# Patient Record
Sex: Female | Born: 1980 | Race: White | Hispanic: No | Marital: Married | Smoking: Former smoker
Health system: Southern US, Community
[De-identification: ages and names within clinical notes are randomized; demographics above are authoritative.]

## PROBLEM LIST (undated history)

## (undated) DIAGNOSIS — E162 Hypoglycemia, unspecified: Secondary | ICD-10-CM

## (undated) DIAGNOSIS — F909 Attention-deficit hyperactivity disorder, unspecified type: Secondary | ICD-10-CM

## (undated) DIAGNOSIS — O24419 Gestational diabetes mellitus in pregnancy, unspecified control: Secondary | ICD-10-CM

## (undated) DIAGNOSIS — D649 Anemia, unspecified: Secondary | ICD-10-CM

## (undated) HISTORY — DX: Gestational diabetes mellitus in pregnancy, unspecified control: O24.419

## (undated) HISTORY — DX: Anemia, unspecified: D64.9

---

## 2009-05-22 ENCOUNTER — Inpatient Hospital Stay (HOSPITAL_COMMUNITY): Admission: AD | Admit: 2009-05-22 | Discharge: 2009-05-22 | Payer: Self-pay | Admitting: Obstetrics & Gynecology

## 2009-05-22 ENCOUNTER — Ambulatory Visit: Payer: Self-pay | Admitting: Obstetrics and Gynecology

## 2010-06-04 LAB — URINALYSIS, ROUTINE W REFLEX MICROSCOPIC
Glucose, UA: NEGATIVE mg/dL
Hgb urine dipstick: NEGATIVE
Ketones, ur: NEGATIVE mg/dL
Protein, ur: NEGATIVE mg/dL
Urobilinogen, UA: 4 mg/dL — ABNORMAL HIGH (ref 0.0–1.0)

## 2011-11-26 ENCOUNTER — Emergency Department (HOSPITAL_COMMUNITY)
Admission: EM | Admit: 2011-11-26 | Discharge: 2011-11-26 | Disposition: A | Discharge status: Home / Self Care | Payer: Self-pay | Attending: Emergency Medicine | Admitting: Emergency Medicine

## 2011-11-26 ENCOUNTER — Encounter (HOSPITAL_COMMUNITY): Payer: Self-pay | Admitting: *Deleted

## 2011-11-26 ENCOUNTER — Emergency Department (HOSPITAL_COMMUNITY): Payer: Self-pay

## 2011-11-26 DIAGNOSIS — Y998 Other external cause status: Secondary | ICD-10-CM | POA: Insufficient documentation

## 2011-11-26 DIAGNOSIS — Y9389 Activity, other specified: Secondary | ICD-10-CM | POA: Insufficient documentation

## 2011-11-26 DIAGNOSIS — T148XXA Other injury of unspecified body region, initial encounter: Secondary | ICD-10-CM

## 2011-11-26 DIAGNOSIS — X500XXA Overexertion from strenuous movement or load, initial encounter: Secondary | ICD-10-CM | POA: Insufficient documentation

## 2011-11-26 DIAGNOSIS — IMO0002 Reserved for concepts with insufficient information to code with codable children: Secondary | ICD-10-CM | POA: Insufficient documentation

## 2011-11-26 LAB — POCT PREGNANCY, URINE: Preg Test, Ur: NEGATIVE

## 2011-11-26 MED ORDER — HYDROCODONE-ACETAMINOPHEN 5-325 MG PO TABS: 1.0000 | ORAL_TABLET | Freq: Four times a day (QID) | ORAL | Status: AC | PRN

## 2011-11-26 MED ORDER — CYCLOBENZAPRINE HCL 10 MG PO TABS: ORAL_TABLET | ORAL | Status: DC

## 2011-11-26 MED ORDER — HYDROCODONE-ACETAMINOPHEN 5-325 MG PO TABS
1.0000 | ORAL_TABLET | Freq: Once | ORAL | Status: AC
  Administered 2011-11-26: 1 via ORAL
  Filled 2011-11-26: qty 1

## 2011-11-26 MED ORDER — IBUPROFEN 800 MG PO TABS
800.0000 mg | ORAL_TABLET | Freq: Once | ORAL | Status: AC
  Administered 2011-11-26: 800 mg via ORAL
  Filled 2011-11-26: qty 1

## 2011-11-26 MED ORDER — CYCLOBENZAPRINE HCL 10 MG PO TABS
10.0000 mg | ORAL_TABLET | Freq: Once | ORAL | Status: AC
  Administered 2011-11-26: 10 mg via ORAL
  Filled 2011-11-26: qty 1

## 2011-11-26 NOTE — ED Provider Notes (Signed)
History     CSN: [id number]  Arrival date & time 11/26/11  2021   None     Chief Complaint  Patient presents with  . Back Pain  . 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  Pain in and around L scapula.  No trauma.  No CP or SOB  Patient is a 31 y.o. female presenting with back pain and shoulder pain. The history is provided by the patient. No language interpreter was used.  Back Pain  This is a new problem. The current episode started yesterday. The problem occurs constantly. The problem has not changed since onset.The pain is associated with lifting heavy objects. Pain location: L scapular area. The quality of the pain is described as stabbing. The pain does not radiate. The pain is severe. The pain is the same all the time. Pertinent negatives include no chest pain, no fever, no numbness, no perianal numbness, no bladder incontinence, no leg pain, no paresthesias, no paresis, no tingling and no weakness. She has tried NSAIDs, heat and ice for the symptoms. The treatment provided no relief.  Shoulder Pain Pertinent negatives include no chest pain, coughing, fever, numbness or weakness.    History reviewed. No pertinent past medical history.  History reviewed. No pertinent past surgical history.  History reviewed. No pertinent family history.  History  Substance Use Topics  . Smoking status: Never Smoker   . Smokeless tobacco: Not on file  . Alcohol Use: No    OB History    Grav Para Term Preterm Abortions TAB SAB Ect Mult Living                  Review of Systems  Constitutional: Negative for fever.  Respiratory: Negative for cough, chest tightness, shortness of breath, wheezing and stridor.   Cardiovascular: Negative for chest pain.  Genitourinary: Negative for bladder incontinence.  Musculoskeletal: Positive for back pain.    Neurological: Negative for tingling, weakness, numbness and paresthesias.  All other systems reviewed and are negative.    Allergies  Sulfa antibiotics  Home Medications   Current Outpatient Rx  Name Route Sig Dispense Refill  . CYCLOBENZAPRINE HCL 10 MG PO TABS  1/2 to one tab po TID 20 tablet 0  . HYDROCODONE-ACETAMINOPHEN 5-325 MG PO TABS Oral Take 1 tablet by mouth every 6 (six) hours as needed for pain. 20 tablet 0    BP 110/69  Pulse 72  Temp 98 F (36.7 C) (Oral)  Resp 20  Ht 5\' 2"  (1.575 m)  Wt 150 lb (68.04 kg)  BMI 27.44 kg/m2  SpO2 98%  LMP 08/26/2011  Physical Exam  Nursing note and vitals reviewed. Constitutional: She is oriented to person, place, and time. She appears well-developed and well-nourished. No distress.  HENT:  Head: Normocephalic and atraumatic.  Eyes: EOM are normal.  Neck: Normal range of motion.  Cardiovascular: Normal rate, regular rhythm and normal heart sounds.   Pulmonary/Chest: Effort normal and breath sounds normal.  Abdominal: Soft. She exhibits no distension. There is no tenderness.  Musculoskeletal: She exhibits tenderness.       Thoracic back: She exhibits decreased range of motion, tenderness and pain. She exhibits no bony tenderness, no spasm and normal pulse.       Back:  Neurological: She is alert and oriented to person, place, and time.  Skin: Skin is warm and dry.  Psychiatric: She  has a normal mood and affect. Judgment normal.    ED Course  Procedures (including critical care time)   Labs Reviewed  POCT PREGNANCY, URINE   Dg Shoulder Left  11/26/2011  *RADIOLOGY REPORT*  Clinical Data: Back pain and shoulder pain.  LEFT SHOULDER - 2+ VIEW  Comparison: None.  Findings: There is no evidence of fracture or dislocation.  There is no evidence of arthropathy or other focal bony abnormality. Soft tissues are unremarkable.  IMPRESSION: Negative exam.   Original Report Authenticated By: Elsie Stain, M.D.      1.  Muscle strain       MDM  E, rx-flexeril, 20 rx-hydrocodone, 20 Ibuprofen 800 mg TID F/u with dr. Romeo Apple prn        Evalina Field, PA 11/26/11 2152

## 2011-11-26 NOTE — ED Notes (Signed)
Pt with left shoulder/upper back pain since yesterday after lifting several heavy bags, has tried ice without relief

## 2011-11-27 NOTE — ED Provider Notes (Signed)
Medical screening examination/treatment/procedure(s) were performed by non-physician practitioner and as supervising physician I was immediately available for consultation/collaboration  Katriel Cutsforth R. Sage Hammill, MD 11/27/11 0021 

## 2012-07-29 ENCOUNTER — Encounter (HOSPITAL_COMMUNITY): Payer: Self-pay | Admitting: Emergency Medicine

## 2012-07-29 ENCOUNTER — Emergency Department (HOSPITAL_COMMUNITY)
Admission: EM | Admit: 2012-07-29 | Discharge: 2012-07-29 | Disposition: A | Discharge status: Home / Self Care | Payer: Medicaid Other | Attending: Emergency Medicine | Admitting: Emergency Medicine

## 2012-07-29 DIAGNOSIS — O9989 Other specified diseases and conditions complicating pregnancy, childbirth and the puerperium: Secondary | ICD-10-CM | POA: Insufficient documentation

## 2012-07-29 DIAGNOSIS — Z3490 Encounter for supervision of normal pregnancy, unspecified, unspecified trimester: Secondary | ICD-10-CM

## 2012-07-29 DIAGNOSIS — F419 Anxiety disorder, unspecified: Secondary | ICD-10-CM

## 2012-07-29 DIAGNOSIS — Z8659 Personal history of other mental and behavioral disorders: Secondary | ICD-10-CM | POA: Insufficient documentation

## 2012-07-29 DIAGNOSIS — Z79899 Other long term (current) drug therapy: Secondary | ICD-10-CM | POA: Insufficient documentation

## 2012-07-29 DIAGNOSIS — O219 Vomiting of pregnancy, unspecified: Secondary | ICD-10-CM | POA: Insufficient documentation

## 2012-07-29 DIAGNOSIS — IMO0002 Reserved for concepts with insufficient information to code with codable children: Secondary | ICD-10-CM | POA: Insufficient documentation

## 2012-07-29 DIAGNOSIS — Z87891 Personal history of nicotine dependence: Secondary | ICD-10-CM | POA: Insufficient documentation

## 2012-07-29 DIAGNOSIS — R109 Unspecified abdominal pain: Secondary | ICD-10-CM | POA: Insufficient documentation

## 2012-07-29 DIAGNOSIS — F411 Generalized anxiety disorder: Secondary | ICD-10-CM | POA: Insufficient documentation

## 2012-07-29 DIAGNOSIS — Z862 Personal history of diseases of the blood and blood-forming organs and certain disorders involving the immune mechanism: Secondary | ICD-10-CM | POA: Insufficient documentation

## 2012-07-29 DIAGNOSIS — Z8639 Personal history of other endocrine, nutritional and metabolic disease: Secondary | ICD-10-CM | POA: Insufficient documentation

## 2012-07-29 HISTORY — DX: Hypoglycemia, unspecified: E16.2

## 2012-07-29 HISTORY — DX: Attention-deficit hyperactivity disorder, unspecified type: F90.9

## 2012-07-29 LAB — RAPID URINE DRUG SCREEN, HOSP PERFORMED
Amphetamines: NOT DETECTED
Barbiturates: NOT DETECTED
Opiates: NOT DETECTED
Tetrahydrocannabinol: NOT DETECTED

## 2012-07-29 LAB — CBC WITH DIFFERENTIAL/PLATELET
Basophils Absolute: 0 10*3/uL (ref 0.0–0.1)
Eosinophils Relative: 0 % (ref 0–5)
HCT: 29.9 % — ABNORMAL LOW (ref 36.0–46.0)
Hemoglobin: 10.6 g/dL — ABNORMAL LOW (ref 12.0–15.0)
Lymphocytes Relative: 16 % (ref 12–46)
Lymphs Abs: 1.9 10*3/uL (ref 0.7–4.0)
MCV: 83.3 fL (ref 78.0–100.0)
Monocytes Absolute: 0.6 10*3/uL (ref 0.1–1.0)
Monocytes Relative: 6 % (ref 3–12)
Neutro Abs: 9.1 10*3/uL — ABNORMAL HIGH (ref 1.7–7.7)
RBC: 3.59 MIL/uL — ABNORMAL LOW (ref 3.87–5.11)
RDW: 14 % (ref 11.5–15.5)
WBC: 11.7 10*3/uL — ABNORMAL HIGH (ref 4.0–10.5)

## 2012-07-29 LAB — BASIC METABOLIC PANEL
CO2: 22 mEq/L (ref 19–32)
Chloride: 102 mEq/L (ref 96–112)
Creatinine, Ser: 0.43 mg/dL — ABNORMAL LOW (ref 0.50–1.10)
Glucose, Bld: 116 mg/dL — ABNORMAL HIGH (ref 70–99)

## 2012-07-29 LAB — HEPATIC FUNCTION PANEL
Alkaline Phosphatase: 62 U/L (ref 39–117)
Total Bilirubin: 0.3 mg/dL (ref 0.3–1.2)
Total Protein: 6.8 g/dL (ref 6.0–8.3)

## 2012-07-29 LAB — LIPASE, BLOOD: Lipase: 28 U/L (ref 11–59)
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
## 2013-03-29 ENCOUNTER — Encounter (HOSPITAL_COMMUNITY): Payer: Self-pay | Admitting: Emergency Medicine

## 2013-03-29 ENCOUNTER — Emergency Department (HOSPITAL_COMMUNITY): Payer: Medicaid Other

## 2013-03-29 ENCOUNTER — Emergency Department (HOSPITAL_COMMUNITY)
Admission: EM | Admit: 2013-03-29 | Discharge: 2013-03-29 | Disposition: A | Discharge status: Home / Self Care | Payer: Medicaid Other | Attending: Emergency Medicine | Admitting: Emergency Medicine

## 2013-03-29 DIAGNOSIS — Z8639 Personal history of other endocrine, nutritional and metabolic disease: Secondary | ICD-10-CM | POA: Insufficient documentation

## 2013-03-29 DIAGNOSIS — K921 Melena: Secondary | ICD-10-CM | POA: Insufficient documentation

## 2013-03-29 DIAGNOSIS — Z862 Personal history of diseases of the blood and blood-forming organs and certain disorders involving the immune mechanism: Secondary | ICD-10-CM | POA: Insufficient documentation

## 2013-03-29 DIAGNOSIS — Z3202 Encounter for pregnancy test, result negative: Secondary | ICD-10-CM | POA: Insufficient documentation

## 2013-03-29 DIAGNOSIS — K5900 Constipation, unspecified: Secondary | ICD-10-CM | POA: Insufficient documentation

## 2013-03-29 DIAGNOSIS — Z87891 Personal history of nicotine dependence: Secondary | ICD-10-CM | POA: Insufficient documentation

## 2013-03-29 DIAGNOSIS — Z79899 Other long term (current) drug therapy: Secondary | ICD-10-CM | POA: Insufficient documentation

## 2013-03-29 DIAGNOSIS — Z8659 Personal history of other mental and behavioral disorders: Secondary | ICD-10-CM | POA: Insufficient documentation

## 2013-03-29 LAB — URINE MICROSCOPIC-ADD ON

## 2013-03-29 LAB — URINALYSIS, ROUTINE W REFLEX MICROSCOPIC
Bilirubin Urine: NEGATIVE
GLUCOSE, UA: NEGATIVE mg/dL
HGB URINE DIPSTICK: NEGATIVE
Ketones, ur: NEGATIVE mg/dL
Nitrite: NEGATIVE
PH: 6 (ref 5.0–8.0)
PROTEIN: NEGATIVE mg/dL
Specific Gravity, Urine: 1.01 (ref 1.005–1.030)
Urobilinogen, UA: 0.2 mg/dL (ref 0.0–1.0)

## 2013-03-29 LAB — PREGNANCY, URINE: PREG TEST UR: NEGATIVE
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
## 2013-03-29 NOTE — Discharge Instructions (Signed)
Constipation, Adult °Constipation is when a person has fewer than 3 bowel movements a week; has difficulty having a bowel movement; or has stools that are dry, hard, or larger than normal. As people grow older, constipation is more common. If you try to fix constipation with medicines that make you have a bowel movement (laxatives), the problem may get worse. Long-term laxative use may cause the muscles of the colon to become weak. A low-fiber diet, not taking in enough fluids, and taking certain medicines may make constipation worse. °CAUSES  °· Certain medicines, such as antidepressants, pain medicine, iron supplements, antacids, and water pills.   °· Certain diseases, such as diabetes, irritable bowel syndrome (IBS), thyroid disease, or depression.   °· Not drinking enough water.   °· Not eating enough fiber-rich foods.   °· Stress or travel. °· Lack of physical activity or exercise. °· Not going to the restroom when there is the urge to have a bowel movement. °· Ignoring the urge to have a bowel movement. °· Using laxatives too much. °SYMPTOMS  °· Having fewer than 3 bowel movements a week.   °· Straining to have a bowel movement.   °· Having hard, dry, or larger than normal stools.   °· Feeling full or bloated.   °· Pain in the lower abdomen. °· Not feeling relief after having a bowel movement. °DIAGNOSIS  °Your caregiver will take a medical history and perform a physical exam. Further testing may be done for severe constipation. Some tests may include:  °· A barium enema X-ray to examine your rectum, colon, and sometimes, your small intestine. °· A sigmoidoscopy to examine your lower colon. °· A colonoscopy to examine your entire colon. °TREATMENT  °Treatment will depend on the severity of your constipation and what is causing it. Some dietary treatments include drinking more fluids and eating more fiber-rich foods. Lifestyle treatments may include regular exercise. If these diet and lifestyle recommendations  do not help, your caregiver may recommend taking over-the-counter laxative medicines to help you have bowel movements. Prescription medicines may be prescribed if over-the-counter medicines do not work.  °HOME CARE INSTRUCTIONS  °· Increase dietary fiber in your diet, such as fruits, vegetables, whole grains, and beans. Limit high-fat and processed sugars in your diet, such as French fries, hamburgers, cookies, candies, and soda.   °· A fiber supplement may be added to your diet if you cannot get enough fiber from foods.   °· Drink enough fluids to keep your urine clear or pale yellow.   °· Exercise regularly or as directed by your caregiver.   °· Go to the restroom when you have the urge to go. Do not hold it. °· Only take medicines as directed by your caregiver. Do not take other medicines for constipation without talking to your caregiver first. °SEEK IMMEDIATE MEDICAL CARE IF:  °· You have bright red blood in your stool.   °· Your constipation lasts for more than 4 days or gets worse.   °· You have abdominal or rectal pain.   °· You have thin, pencil-like stools. °· You have unexplained weight loss. °MAKE SURE YOU:  °· Understand these instructions. °· Will watch your condition. °· Will get help right away if you are not doing well or get worse. °Document Released: 11/24/2003 Document Revised: 05/20/2011 Document Reviewed: 12/07/2012 °ExitCare® Patient Information ©2014 [employer]. ° °Fiber Content in Foods °Drinking plenty of fluids and consuming foods high in fiber can help with constipation. See the list below for the fiber content of some common foods. °Starches and Grains / Dietary   Fiber (g)  Cheerios, 1 cup / 3 g  Kellogg's Corn Flakes, 1 cup / 0.7 g  Rice Krispies, 1  cup / 0.3 g  Quaker Oat Life Cereal,  cup / 2.1 g  Oatmeal, instant (cooked),  cup / 2 g  Kellogg's Frosted Mini Wheats, 1 cup / 5.1 g  Rice, brown, long-grain (cooked), 1 cup / 3.5 g  Rice, white, long-grain (cooked),  1 cup / 0.6 g  Macaroni, cooked, enriched, 1 cup / 2.5 g Legumes / Dietary Fiber (g)  Beans, baked, canned, plain or vegetarian,  cup / 5.2 g  Beans, kidney, canned,  cup / 6.8 g  Beans, pinto, dried (cooked),  cup / 7.7 g  Beans, pinto, canned,  cup / 5.5 g Breads and Crackers / Dietary Fiber (g)  Graham crackers, plain or honey, 2 squares / 0.7 g  Saltine crackers, 3 squares / 0.3 g  Pretzels, plain, salted, 10 pieces / 1.8 g  Bread, whole-wheat, 1 slice / 1.9 g  Bread, white, 1 slice / 0.7 g  Bread, raisin, 1 slice / 1.2 g  Bagel, plain, 3 oz / 2 g  Tortilla, flour, 1 oz / 0.9 g  Tortilla, corn, 1 small / 1.5 g  Bun, hamburger or hotdog, 1 small / 0.9 g Fruits / Dietary Fiber (g)  Apple, raw with skin, 1 medium / 4.4 g  Applesauce, sweetened,  cup / 1.5 g  Banana,  medium / 1.5 g  Grapes, 10 grapes / 0.4 g  Orange, 1 small / 2.3 g  Raisin, 1.5 oz / 1.6 g  Melon, 1 cup / 1.4 g Vegetables / Dietary Fiber (g)  Green beans, canned,  cup / 1.3 g  Carrots (cooked),  cup / 2.3 g  Broccoli (cooked),  cup / 2.8 g  Peas, frozen (cooked),  cup / 4.4 g  Potatoes, mashed,  cup / 1.6 g  Lettuce, 1 cup / 0.5 g  Corn, canned,  cup / 1.6 g  Tomato,  cup / 1.1 g Document Released: 07/14/2006 Document Revised: 05/20/2011 Document Reviewed: 09/08/2006 ExitCare Patient Information 2014 [employer] [place].   I recommended she begin using Metamucil and MiraLax daily. You may also continue your stool softeners. You may use over-the-counter Fleet enemas as well. Please continue to drink plenty of water and eat foods high in fiber. If you begin to have worsening abdominal pain, vomiting, fever, please return to the emergency department.

## 2013-03-29 NOTE — ED Notes (Signed)
Pt reports problems w/ constipation since oct. 2014, when she had a baby.  Reports having 3 bowels movements since oct. Has been taking stool softeners. Has not tried any other meds. Only here today because her husband made her come.

## 2013-05-26 ENCOUNTER — Emergency Department (HOSPITAL_COMMUNITY): Payer: Medicaid Other

## 2013-05-26 ENCOUNTER — Emergency Department (HOSPITAL_COMMUNITY)
Admission: EM | Admit: 2013-05-26 | Discharge: 2013-05-26 | Disposition: A | Discharge status: Home / Self Care | Payer: Medicaid Other | Attending: Emergency Medicine | Admitting: Emergency Medicine

## 2013-05-26 ENCOUNTER — Encounter (HOSPITAL_COMMUNITY): Payer: Self-pay | Admitting: Emergency Medicine

## 2013-05-26 DIAGNOSIS — R0781 Pleurodynia: Secondary | ICD-10-CM

## 2013-05-26 DIAGNOSIS — Z8659 Personal history of other mental and behavioral disorders: Secondary | ICD-10-CM | POA: Insufficient documentation

## 2013-05-26 DIAGNOSIS — R079 Chest pain, unspecified: Secondary | ICD-10-CM | POA: Insufficient documentation

## 2013-05-26 DIAGNOSIS — Z8639 Personal history of other endocrine, nutritional and metabolic disease: Secondary | ICD-10-CM | POA: Insufficient documentation

## 2013-05-26 DIAGNOSIS — Z3202 Encounter for pregnancy test, result negative: Secondary | ICD-10-CM | POA: Insufficient documentation

## 2013-05-26 DIAGNOSIS — Z862 Personal history of diseases of the blood and blood-forming organs and certain disorders involving the immune mechanism: Secondary | ICD-10-CM | POA: Insufficient documentation

## 2013-05-26 DIAGNOSIS — Z79899 Other long term (current) drug therapy: Secondary | ICD-10-CM | POA: Insufficient documentation

## 2013-05-26 DIAGNOSIS — Z87891 Personal history of nicotine dependence: Secondary | ICD-10-CM | POA: Insufficient documentation

## 2013-05-26 LAB — URINALYSIS, ROUTINE W REFLEX MICROSCOPIC
Bilirubin Urine: NEGATIVE
GLUCOSE, UA: NEGATIVE mg/dL
Hgb urine dipstick: NEGATIVE
Ketones, ur: NEGATIVE mg/dL
Nitrite: NEGATIVE
Protein, ur: NEGATIVE mg/dL
SPECIFIC GRAVITY, URINE: 1.02 (ref 1.005–1.030)
Urobilinogen, UA: 0.2 mg/dL (ref 0.0–1.0)
pH: 7 (ref 5.0–8.0)

## 2013-05-26 LAB — POC URINE PREG, ED: Preg Test, Ur: NEGATIVE

## 2013-05-26 LAB — URINE MICROSCOPIC-ADD ON

## 2013-05-26 MED ORDER — CYCLOBENZAPRINE HCL 10 MG PO TABS
10.0000 mg | ORAL_TABLET | Freq: Once | ORAL | Status: AC
  Administered 2013-05-26: 10 mg via ORAL
  Filled 2013-05-26: qty 1

## 2013-05-26 MED ORDER — CYCLOBENZAPRINE HCL 10 MG PO TABS: 10.0000 mg | ORAL_TABLET | Freq: Two times a day (BID) | ORAL | Status: DC | PRN

## 2013-05-26 MED ORDER — ACETAMINOPHEN 500 MG PO TABS
1000.0000 mg | ORAL_TABLET | Freq: Once | ORAL | Status: AC
  Administered 2013-05-26: 1000 mg via ORAL
  Filled 2013-05-26: qty 2

## 2013-05-26 MED ORDER — IBUPROFEN 800 MG PO TABS
800.0000 mg | ORAL_TABLET | Freq: Once | ORAL | Status: AC
  Administered 2013-05-26: 800 mg via ORAL
  Filled 2013-05-26: qty 1

## 2013-05-26 MED ORDER — NAPROXEN 500 MG PO TABS: 500.0000 mg | ORAL_TABLET | Freq: Two times a day (BID) | ORAL | Status: DC

## 2013-05-26 NOTE — ED Notes (Signed)
Pt c/o knot to the left upper quadrant x one week.

## 2013-05-26 NOTE — ED Provider Notes (Signed)
CSN: [id number]     Arrival date & time 05/26/13  1902 History  This chart was scribed for Victoria HutchingBrian Weslie Pretlow, MD by Blanchard KelchNicole Curnes, ED Scribe. The patient was seen in room APA07/APA07. Patient's care was started at 9:06 PM.      Chief Complaint  Patient presents with  . Abdominal Pain     The history is provided by the patient. No language interpreter was used.    HPI Comments: Dutch QuintJulie Overby is a 33 y.o. female who presents to the Emergency Department complaining of constant, unchanged right inferior lateral rib area pain that began five days ago. She denies any recent injury or trauma to the area. She describes the pain as sharp. The pain is worsened by movement. She denies aggravation from eating. She denies taking any medication for the pain. She denies fever, cough, chills, dysuria or unexplained weight loss or appetite change.     Past Medical History  Diagnosis Date  . ADHD (attention deficit hyperactivity disorder)   . Hypoglycemia    History reviewed. No pertinent past surgical history. History reviewed. No pertinent family history. History  Substance Use Topics  . Smoking status: Former Games developermoker  . Smokeless tobacco: Not on file  . Alcohol Use: No   OB History   Grav Para Term Preterm Abortions TAB SAB Ect Mult Living   1              Review of Systems A complete 10 system review of systems was obtained and all systems are negative except as noted in the HPI and PMH.     Allergies  Sulfa antibiotics  Home Medications   Current Outpatient Rx  Name  Route  Sig  Dispense  Refill  . citalopram (CELEXA) 20 MG tablet   Oral   Take 20 mg by mouth daily.         . cyclobenzaprine (FLEXERIL) 10 MG tablet   Oral   Take 1 tablet (10 mg total) by mouth 2 (two) times daily as needed for muscle spasms.   20 tablet   0   . naproxen (NAPROSYN) 500 MG tablet   Oral   Take 1 tablet (500 mg total) by mouth 2 (two) times daily.   20 tablet   0    Triage Vitals: BP 105/65   Pulse 80  Temp(Src) 98.1 F (36.7 C) (Oral)  Resp 20  Ht 5\' 2"  (1.575 m)  Wt 155 lb (70.308 kg)  BMI 28.34 kg/m2  SpO2 97%  LMP 05/17/2013   Physical Exam  Nursing note and vitals reviewed. Constitutional: She is oriented to person, place, and time. She appears well-developed and well-nourished.  HENT:  Head: Normocephalic and atraumatic.  Eyes: Conjunctivae and EOM are normal. Pupils are equal, round, and reactive to light.  Neck: Normal range of motion. Neck supple.  Cardiovascular: Normal rate, regular rhythm and normal heart sounds.   Pulmonary/Chest: Effort normal and breath sounds normal.  Abdominal: Soft. Bowel sounds are normal.  Musculoskeletal: Normal range of motion.  Tenderness to right inferior lateral rib area.  Neurological: She is alert and oriented to person, place, and time.  Skin: Skin is warm and dry.  Psychiatric: She has a normal mood and affect. Her behavior is normal.    ED Course  Procedures (including critical care time)  DIAGNOSTIC STUDIES: Oxygen Saturation is 97% on room air, adequate by my interpretation.    COORDINATION OF CARE: 9:11 PM -Will order chest x-ray and pain medication. Patient  verbalizes understanding and agrees with treatment plan.    Labs Review Labs Reviewed  URINALYSIS, ROUTINE W REFLEX MICROSCOPIC - Abnormal; Notable for the following:    Leukocytes, UA SMALL (*)    All other components within normal limits  URINE MICROSCOPIC-ADD ON - Abnormal; Notable for the following:    Squamous Epithelial / LPF MANY (*)    Bacteria, UA FEW (*)    All other components within normal limits  POC URINE PREG, ED   Imaging Review Dg Ribs Unilateral W/chest Right  05/26/2013   CLINICAL DATA:  Pain.  EXAM: RIGHT RIBS AND CHEST - 3+ VIEW  COMPARISON:  DG ABD ACUTE W/CHEST dated 03/29/2013  FINDINGS: No fracture or other bone lesions are seen involving the ribs. There is no evidence of pneumothorax or pleural effusion. Both lungs are clear.  Heart size and mediastinal contours are within normal limits.  IMPRESSION: Negative.   Electronically Signed   By: Maisie Fus  Register   On: 05/26/2013 22:48     EKG Interpretation None      MDM   Final diagnoses:  Rib pain on right side    Patient is in no acute distress. She is most tender in the right inferior lateral rib area. X-ray negative. Discharge medications Naprosyn 500 mg and Flexeril 10 mg   I personally performed the services described in this documentation, which was scribed in my presence. The recorded information has been reviewed and is accurate.    Victoria Hutching, MD 05/26/13 [phone]

## 2013-05-26 NOTE — ED Notes (Signed)
Pt reporting pain in RUQ for past 2 days.  Denies nausea or vomiting. States "I don't feel sick, I just hurt."

## 2013-05-26 NOTE — Discharge Instructions (Signed)
Chest x-ray was normal. Medication for pain and muscle spasm. Return if worse or followup your Dr.

## 2013-06-22 ENCOUNTER — Encounter (HOSPITAL_COMMUNITY): Payer: Self-pay | Admitting: Emergency Medicine

## 2013-06-22 ENCOUNTER — Emergency Department (HOSPITAL_COMMUNITY)
Admission: EM | Admit: 2013-06-22 | Discharge: 2013-06-22 | Disposition: A | Discharge status: Home / Self Care | Payer: Medicaid Other | Attending: Emergency Medicine | Admitting: Emergency Medicine

## 2013-06-22 DIAGNOSIS — S39012A Strain of muscle, fascia and tendon of lower back, initial encounter: Secondary | ICD-10-CM

## 2013-06-22 DIAGNOSIS — S335XXA Sprain of ligaments of lumbar spine, initial encounter: Secondary | ICD-10-CM | POA: Insufficient documentation

## 2013-06-22 DIAGNOSIS — S139XXA Sprain of joints and ligaments of unspecified parts of neck, initial encounter: Secondary | ICD-10-CM | POA: Insufficient documentation

## 2013-06-22 DIAGNOSIS — Z79899 Other long term (current) drug therapy: Secondary | ICD-10-CM | POA: Insufficient documentation

## 2013-06-22 DIAGNOSIS — Z791 Long term (current) use of non-steroidal anti-inflammatories (NSAID): Secondary | ICD-10-CM | POA: Insufficient documentation

## 2013-06-22 DIAGNOSIS — Z862 Personal history of diseases of the blood and blood-forming organs and certain disorders involving the immune mechanism: Secondary | ICD-10-CM | POA: Insufficient documentation

## 2013-06-22 DIAGNOSIS — Z8639 Personal history of other endocrine, nutritional and metabolic disease: Secondary | ICD-10-CM | POA: Insufficient documentation

## 2013-06-22 DIAGNOSIS — Y9389 Activity, other specified: Secondary | ICD-10-CM | POA: Insufficient documentation

## 2013-06-22 DIAGNOSIS — S161XXA Strain of muscle, fascia and tendon at neck level, initial encounter: Secondary | ICD-10-CM

## 2013-06-22 DIAGNOSIS — IMO0002 Reserved for concepts with insufficient information to code with codable children: Secondary | ICD-10-CM

## 2013-06-22 DIAGNOSIS — Z87891 Personal history of nicotine dependence: Secondary | ICD-10-CM | POA: Insufficient documentation

## 2013-06-22 DIAGNOSIS — F909 Attention-deficit hyperactivity disorder, unspecified type: Secondary | ICD-10-CM | POA: Insufficient documentation

## 2013-06-22 DIAGNOSIS — Y92410 Unspecified street and highway as the place of occurrence of the external cause: Secondary | ICD-10-CM | POA: Insufficient documentation

## 2013-06-22 MED ORDER — HYDROCODONE-ACETAMINOPHEN 5-325 MG PO TABS: 2.0000 | ORAL_TABLET | Freq: Four times a day (QID) | ORAL | Status: DC | PRN

## 2013-06-22 MED ORDER — HYDROCODONE-ACETAMINOPHEN 5-325 MG PO TABS
2.0000 | ORAL_TABLET | Freq: Once | ORAL | Status: AC
  Administered 2013-06-22: 2 via ORAL
  Filled 2013-06-22: qty 2
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
## 2013-06-22 NOTE — Discharge Instructions (Signed)
SEEK IMMEDIATE MEDICAL ATTENTION IF: New numbness, tingling, weakness, or problem with the use of your arms or legs.  Severe back pain not relieved with medications.  Change in bowel or bladder control.  Increasing pain in any areas of the body (such as chest or abdominal pain).  Shortness of breath, dizziness or fainting.  Nausea (feeling sick to your stomach), vomiting, fever, or sweats.  You have neck pain, possibly from a cervical strain and/or pinched nerve.  SEEK IMMEDIATE MEDICAL ATTENTION IF: You develop difficulties swallowing or breathing.  You have new or worse numbness, weakness, tingling, or movement problems in your arms or legs.  You develop increasing pain which is uncontrolled with medications.  You have change in bowel or bladder function, or other concerns.  Emergency Department Resource Guide 1) Find a Doctor and Pay Out of Pocket Although you won't have to find out who is covered by your insurance plan, it is a good idea to ask around and get recommendations. You will then need to call the office and see if the doctor you have chosen will accept you as a new patient and what types of options they offer for patients who are self-pay. Some doctors offer discounts or will set up payment plans for their patients who do not have insurance, but you will need to ask so you aren't surprised when you get to your appointment.  2) Contact Your Local Health Department Not all health departments have doctors that can see patients for sick visits, but many do, so it is worth a call to see if yours does. If you don't know where your local health department is, you can check in your phone book. The CDC also has a tool to help you locate your state's health department, and many state websites also have listings of all of their local health departments.  3) Find a Walk-in Clinic If your illness is not likely to be very severe or complicated, you may want to try a walk in clinic. These are  popping up all over the country in pharmacies, drugstores, and shopping centers. They're usually staffed by nurse practitioners or physician assistants that have been trained to treat common illnesses and complaints. They're usually fairly quick and inexpensive. However, if you have serious medical issues or chronic medical problems, these are probably not your best option.  No Primary Care Doctor: - Call Health Connect at  [phone] - they can help you locate a primary care doctor that  accepts your insurance, provides certain services, etc. - Physician Referral Service- [phone]  Chronic Pain Problems: Organization         Address  Phone   Notes  [facility]  [phone] Patients need to be referred by their primary care doctor.   Medication Assistance: Organization         Address  Phone   Notes  [facility] Medication [facility] [address] [place], [place] [address] [phone] --Must be a resident of [facility] -- Must have NO insurance coverage whatsoever (no Medicaid/ Medicare, etc.) -- The pt. MUST have a primary care doctor that directs their care regularly and follows them in the community   [facility]ist  [phone]   [employer]  [phone]    Agencies that provide inexpensive medical care: Organization         Address  Phone   Notes  [facility]  [phone]   [facility]  [facility]    [phone]) [phone]   [facility] [address] [place], [place] [address] [phone]   [facility] [place] [address], [place] [phone]) [phone]   [facility]anned Parenthood    [phone](336) [phone]   [facility]    [phone]   [facility]  [address], [place] Phone:  [phone], Fax:  [phone] Hours of Operation:  9 am - 6 pm, M-F.  Also accepts Medicaid/Medicare and self-pay.  [facility]  [address], [place] Phone: [phone]) [phone], Fax: [phone](336) [phone]. Hours of Operation:  8:30 am - 5:30 pm, M-F.  Also accepts Medicaid and self-pay.  [facility] [place] [address], [place] Phone: [phone]) [phone]   [facility] [address], [place], [place] [phone], Ext. [phone] Mondays & Thursdays: 7-9 AM.  First 15 patients are seen on a first come, first serve basis.    Medicaid-accepting [facility] Providers:  Organization         Address  Phone   Notes  [facility] [address], [place] [phone]) [phone] Also accepts self-pay patients.  [facility] [address], [place]  [phone]) [phone]   [facility] [address], [place] [phone]) [phone]   [facility] [address] [place] [address], [place] [phone]) [phone]   Renaye RakersVeita Bland [address], [place]   [phone]) [phone] Only accepts [place] Access [place] patients after they have their name applied to their card.   Self-Pay (no insurance) in [facility]:  Organization         Address  Phone   Notes  Sickle Cell Patients, [facility] [address], [place] [phone]) [phone]   [facility] [address], [place] [phone]) [phone]   [facility]  [address], [place] [phone]) [phone]   [facility]/Dr. Osei-Bonsu  [address] [place] [address], [place] or [id number], [place] [phone]) [phone] Phone number for both [place] and [place] locations is the same.  [facility] [address], [place] [phone]) [phone]   [facility] [address] [place] [address], [place] or [address] [phone](336) [phone]) [phone]   [facility] [address], [place] [phone]) [phone], phone; [phone], fax Sees patients 1st and 3rd Saturday  of every month.  Must not qualify for public or private insurance (i.e. Medicaid, Medicare, [place] Health Choice, Veterans' Benefits)  Household income should be no more than 200% of the poverty level The clinic cannot treat you if you are pregnant or think you are pregnant  Sexually transmitted diseases are not treated at the clinic.   Dental Care: Organization         Address  Phone  Notes  [facility] of [facility] [address], [place] [phone] Accepts children up to age 32 who are enrolled in [place]icaid or [place] Health Choice; pregnant women with a Medicaid card; and children who have applied for Medicaid or [place] Health Choice, but were declined, whose parents can pay a reduced fee at time of service.  [facility] of [facility] [place]  [address], [place] [phone] Accepts children up to age 64 who are enrolled in Medicaid or [place] Health Choice; pregnant women with a Medicaid card; and children who have applied for Medicaid or [place] Health Choice, but were declined, whose parents can pay a reduced fee at time of service.  [facility]  [address] [place], [place] [phone] Patients are seen by appointment only. Walk-ins are not accepted. [facility] will see patients 12 years of age and older. Monday - Tuesday (8am-5pm) Most Wednesdays (8:30-5pm) $30 per visit, cash only  [facility] Access PRO[facility]  [address], [facility] [phone] Patients are seen by appointment only. Walk-ins are not accepted. [facility] will see patients 26 years of age and older. One Wednesday Evening (Monthly: Volunteer Based).  $30 per visit, cash only  [employer]  [phone] for adults; Children under age 42, call Graduate Pediatric Dentistry at [phone]. Children aged 61-14, please call [phone] to request a pediatric application.  Dental  services are provided in all areas of dental care including fillings, crowns and bridges, complete and partial dentures, implants, gum treatment, root canals, and extractions. Preventive care is also provided. Treatment is provided to both adults and children. Patients are selected via a lottery and there is often a waiting list.   [facility] [address] [place]  [phone] [email or web address]   [facility] [address] [place], [place] [phone], Ext. [phone] Second and Fourth Thursday of each month, opens at 6:30 AM; Clinic ends at 9 AM.  Patients are seen on a first-come first-served basis, and a limited number are seen during each clinic.   [facility]  [address] [place], [place] [phone]   Eligibility Requirements You must have lived in [place], [place], or [place] counties for at least the last three months.   You cannot be eligible for state or federal sponsored [place], including [employer], [place], or [employer].   You generally cannot be eligible for healthcare insurance through your employer.    How to apply: Eligibility screenings are held every Tuesday and Wednesday afternoon from 1:00 pm until 4:00 pm. You do not need an appointment for the interview!  [facility] [address] [place], [place] [phone]   [facility] Health Department  [phone]   [facility] Health Department  [phone]   [facility] Health Department  [phone]    Behavioral Health Resources in the Community: Intensive Outpatient Programs Organization         Address  Phone  Notes  [facility] [address] [place], [place] [phone]   [facility] [address], [place], [place] [phone]   [facility]: [facility] [address] [place], [place]  [phone]   [facility] Mental Health [address],    [place], [place] [phone] or [phone]   Substance Abuse Resources Organization         Address  Phone  Notes  [facility]  [phone]   [facility]  [phone]   [facility]  [phone]   Floydene Flock  [phone]   Residential & Outpatient Substance Abuse Program  [phone]   Psychological Services Organization         Address  Phone  Notes  [facility]  [phone]   [facility]  [phone]  [facility] [address], [place] [phone] or [phone]    Mobile Crisis Teams Organization         Address  Phone  Notes  [facility], Mobile Crisis Care Unit  [phone]   [facility]  [address] [place], [place] [phone]   Doristine LocksSharon DeEsch [address] [place] [phone]    Self-Help/Support Groups Organization         Address  Phone             Notes  [facility] [place] - variety of support groups  336- [phone] Call for more information  Narcotics Anonymous (NA), [facility] [address], [place]  2 meetings at this location   Statisticianesidential Treatment Programs Organization         Address  Phone  Notes  [facility] [address],    [place]  [phone]   [facility]  [address], [place] [phone], [place], [place] [phone]   [facility] [address], [place] [phone] Admissions: 8am-3pm M-F  [facility] [address]    [place], [place] [phone]   [facility] [address], [place], [place] [phone]   [facility] [address]  [place], [place] [phone]   [facility] - Intensive Outpatient [address], [place], [place] [phone]   [facility] [address]  [place], [place]  [phone] or [phone]   [facility] ([facility]) [address] [place], [place] [phone] Accepts Medicaid  [facility] [address]  [place] [phone] Substance Abuse/Addiction Treatment   [facility] Behavioral Health Resources Organization         Address  Phone  Notes  [facility]  [phone]) [phone]   Angie FavaJulie Brannon, PhD [address], [address] [place], [place]   [phone]) [phone] or [phone](336) [phone]   [facility]   [address] [place], [place] [phone](336) [phone]   [facility] [address], [place], [place] [phone](336) [phone] Insurance/Medicaid/sponsorship through [facility]  [facility] [address]                                    [place], [place] [phone]) [phone] Therapy/tele-psych/case  [facility] [address]   [place], [place] [phone]) [phone]    Dr. Lolly MustacheArfeen  [phone]) [phone]   [facility] [place]  [employer] [facility] 1) [address], [place] 2) [address], [place] 3)  [address], [place] [phone]) [phone](336) [phone]  [phone](336) [phone]   [facility] Child Abuse Hotline [phone]) [phone] or [phone]) [phone] (After Hours)
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
## 2014-01-10 ENCOUNTER — Encounter (HOSPITAL_COMMUNITY): Payer: Self-pay | Admitting: Emergency Medicine

## 2014-02-07 ENCOUNTER — Emergency Department (HOSPITAL_COMMUNITY): Payer: Medicaid Other

## 2014-02-07 ENCOUNTER — Encounter (HOSPITAL_COMMUNITY): Payer: Self-pay | Admitting: Cardiology

## 2014-02-07 ENCOUNTER — Emergency Department (HOSPITAL_COMMUNITY)
Admission: EM | Admit: 2014-02-07 | Discharge: 2014-02-07 | Disposition: A | Discharge status: Home / Self Care | Payer: Medicaid Other | Attending: Emergency Medicine | Admitting: Emergency Medicine

## 2014-02-07 DIAGNOSIS — Y9389 Activity, other specified: Secondary | ICD-10-CM | POA: Diagnosis not present

## 2014-02-07 DIAGNOSIS — Y990 Civilian activity done for income or pay: Secondary | ICD-10-CM | POA: Insufficient documentation

## 2014-02-07 DIAGNOSIS — T1490XA Injury, unspecified, initial encounter: Secondary | ICD-10-CM

## 2014-02-07 DIAGNOSIS — Z87891 Personal history of nicotine dependence: Secondary | ICD-10-CM | POA: Diagnosis not present

## 2014-02-07 DIAGNOSIS — Y9289 Other specified places as the place of occurrence of the external cause: Secondary | ICD-10-CM | POA: Insufficient documentation

## 2014-02-07 DIAGNOSIS — S99911A Unspecified injury of right ankle, initial encounter: Secondary | ICD-10-CM | POA: Diagnosis present

## 2014-02-07 DIAGNOSIS — S8011XA Contusion of right lower leg, initial encounter: Secondary | ICD-10-CM | POA: Diagnosis not present

## 2014-02-07 DIAGNOSIS — W228XXA Striking against or struck by other objects, initial encounter: Secondary | ICD-10-CM | POA: Diagnosis not present

## 2014-02-07 DIAGNOSIS — Z8639 Personal history of other endocrine, nutritional and metabolic disease: Secondary | ICD-10-CM | POA: Diagnosis not present

## 2014-02-07 DIAGNOSIS — Z8659 Personal history of other mental and behavioral disorders: Secondary | ICD-10-CM | POA: Insufficient documentation

## 2014-02-07 MED ORDER — IBUPROFEN 600 MG PO TABS: 600.0000 mg | ORAL_TABLET | Freq: Four times a day (QID) | ORAL | Status: DC | PRN

## 2014-02-07 MED ORDER — IBUPROFEN 800 MG PO TABS
800.0000 mg | ORAL_TABLET | Freq: Once | ORAL | Status: AC
  Administered 2014-02-07: 800 mg via ORAL
  Filled 2014-02-07: qty 1

## 2014-02-07 NOTE — Discharge Instructions (Signed)
Contusion °A contusion is a deep bruise. Contusions are the result of an injury that caused bleeding under the skin. The contusion may turn blue, purple, or yellow. Minor injuries will give you a painless contusion, but more severe contusions may stay painful and swollen for a few weeks.  °CAUSES  °A contusion is usually caused by a blow, trauma, or direct force to an area of the body. °SYMPTOMS  °· Swelling and redness of the injured area. °· Bruising of the injured area. °· Tenderness and soreness of the injured area. °· Pain. °DIAGNOSIS  °The diagnosis can be made by taking a history and physical exam. An X-ray, CT scan, or MRI may be needed to determine if there were any associated injuries, such as fractures. °TREATMENT  °Specific treatment will depend on what area of the body was injured. In general, the best treatment for a contusion is resting, icing, elevating, and applying cold compresses to the injured area. Over-the-counter medicines may also be recommended for pain control. Ask your caregiver what the best treatment is for your contusion. °HOME CARE INSTRUCTIONS  °· Put ice on the injured area. °¨ Put ice in a plastic bag. °¨ Place a towel between your skin and the bag. °¨ Leave the ice on for 15-20 minutes, 3-4 times a day, or as directed by your health care provider. °· Only take over-the-counter or prescription medicines for pain, discomfort, or fever as directed by your caregiver. Your caregiver may recommend avoiding anti-inflammatory medicines (aspirin, ibuprofen, and naproxen) for 48 hours because these medicines may increase bruising. °· Rest the injured area. °· If possible, elevate the injured area to reduce swelling. °SEEK IMMEDIATE MEDICAL CARE IF:  °· You have increased bruising or swelling. °· You have pain that is getting worse. °· Your swelling or pain is not relieved with medicines. °MAKE SURE YOU:  °· Understand these instructions. °· Will watch your condition. °· Will get help right  away if you are not doing well or get worse. °Document Released: 12/05/2004 Document Revised: 03/02/2013 Document Reviewed: 12/31/2010 °ExitCare® Patient Information ©2015 [employer] LLC. This information is not intended to replace advice given to you by your health care provider. Make sure you discuss any questions you have with your health care provider. ° °

## 2014-02-07 NOTE — ED Notes (Signed)
Ran into metal rod last night at work.  C/o pain to right lower leg.  Some bruising to right lower leg.

## 2014-02-08 NOTE — ED Provider Notes (Signed)
CSN: [id number]     Arrival date & time 02/07/14  0830 History   First MD Initiated Contact with Patient 02/07/14 [phone]     Chief Complaint  Patient presents with  . Ankle Pain     (Consider location/radiation/quality/duration/timing/severity/associated sxs/prior Treatment) The history is provided by the patient.   Victoria Savage is a 33 y.o. female presenting with pain, bruising and swelling at her right lower anterior tibia.  She accidentally struck the leg on a metal rod while at work last night.  She has worsening pain at the site out of proportion for a simple bruise and is concerned about possible fracture.  Her pain is constant, worsened with extension of the foot (not flexion) and not worsened with weight bearing.  She has applied ice, used elevation with improvement in the swelling but not pain.    Past Medical History  Diagnosis Date  . ADHD (attention deficit hyperactivity disorder)   . Hypoglycemia    History reviewed. No pertinent past surgical history. History reviewed. No pertinent family history. History  Substance Use Topics  . Smoking status: Former Games developermoker  . Smokeless tobacco: Not on file  . Alcohol Use: No   OB History    Gravida Para Term Preterm AB TAB SAB Ectopic Multiple Living   1              Review of Systems  Constitutional: Negative for fever.  Musculoskeletal: Positive for arthralgias. Negative for myalgias and joint swelling.  Skin: Positive for color change.  Neurological: Negative for weakness and numbness.      Allergies  Sulfa antibiotics  Home Medications   Prior to Admission medications   Medication Sig Start Date End Date Taking? Authorizing Provider  citalopram (CELEXA) 20 MG tablet Take 20 mg by mouth daily.    Historical Provider, MD  cyclobenzaprine (FLEXERIL) 10 MG tablet Take 1 tablet (10 mg total) by mouth 2 (two) times daily as needed for muscle spasms. 05/26/13   Donnetta HutchingBrian Cook, MD  HYDROcodone-acetaminophen (NORCO) 5-325 MG  per tablet Take 2 tablets by mouth every 6 (six) hours as needed for severe pain. 06/22/13   Hurman HornJohn M Bednar, MD  ibuprofen (ADVIL,MOTRIN) 600 MG tablet Take 1 tablet (600 mg total) by mouth every 6 (six) hours as needed. 02/07/14   Burgess AmorJulie Irving Bloor, PA-C  naproxen (NAPROSYN) 500 MG tablet Take 1 tablet (500 mg total) by mouth 2 (two) times daily. 05/26/13   Donnetta HutchingBrian Cook, MD   BP 119/79 mmHg  Pulse 86  Temp(Src) 98 F (36.7 C) (Oral)  Resp 16  Ht 5\' 2"  (1.575 m)  Wt 170 lb (77.111 kg)  BMI 31.09 kg/m2  SpO2 100%  LMP 01/11/2014 Physical Exam  Constitutional: She appears well-developed and well-nourished.  HENT:  Head: Atraumatic.  Neck: Normal range of motion.  Cardiovascular:  Pulses:      Dorsalis pedis pulses are 2+ on the right side.  Pulses equal bilaterally  Musculoskeletal: She exhibits edema and tenderness.       Right lower leg: She exhibits bony tenderness, swelling and edema. She exhibits no deformity.       Legs: No palpable bony deformity.    Neurological: She is alert. She has normal strength. She displays normal reflexes. No sensory deficit.  Skin: Skin is warm and dry.  Psychiatric: She has a normal mood and affect.    ED Course  Procedures (including critical care time) Labs Review Labs Reviewed - No data to display  Imaging Review  Dg Tibia/fibula Right  02/07/2014   CLINICAL DATA:  Mid shin pain status post injury last night  EXAM: RIGHT TIBIA AND FIBULA - 2 VIEW  COMPARISON:  None.  FINDINGS: The right tibia and fibula are adequately mineralized. There is no acute fracture nor dislocation. The soft tissues exhibit no foreign bodies or abnormal gas collections. The observed portions of the right knee and right ankle are unremarkable.  IMPRESSION: There is no acute bony abnormality of the right tibia or fibula.   Electronically Signed   By: David  [place]   On: 02/07/2014 09:19     EKG Interpretation None      MDM   Final diagnoses:  Injury  Contusion of right  leg, initial encounter    Patients labs and/or radiological studies were viewed and considered during the medical decision making and disposition process. Encouraged continued ice, elevation, ibuprofen. Jones dressing applied.  Prn f/u anticipated.    Burgess AmorJulie Jaelyn Bourgoin, PA-C 02/08/14 1305  Benny LennertJoseph L Zammit, MD 02/08/14 [phone]

## 2015-08-05 IMAGING — CR DG TIBIA/FIBULA 2V*R*
2 series · 2 of 2 positions shown · non-contrast
Comparison: None.

CLINICAL DATA: Mid shin pain status post injury last night

EXAM:
RIGHT TIBIA AND FIBULA - 2 VIEW

[view not recorded (1 of 2)]
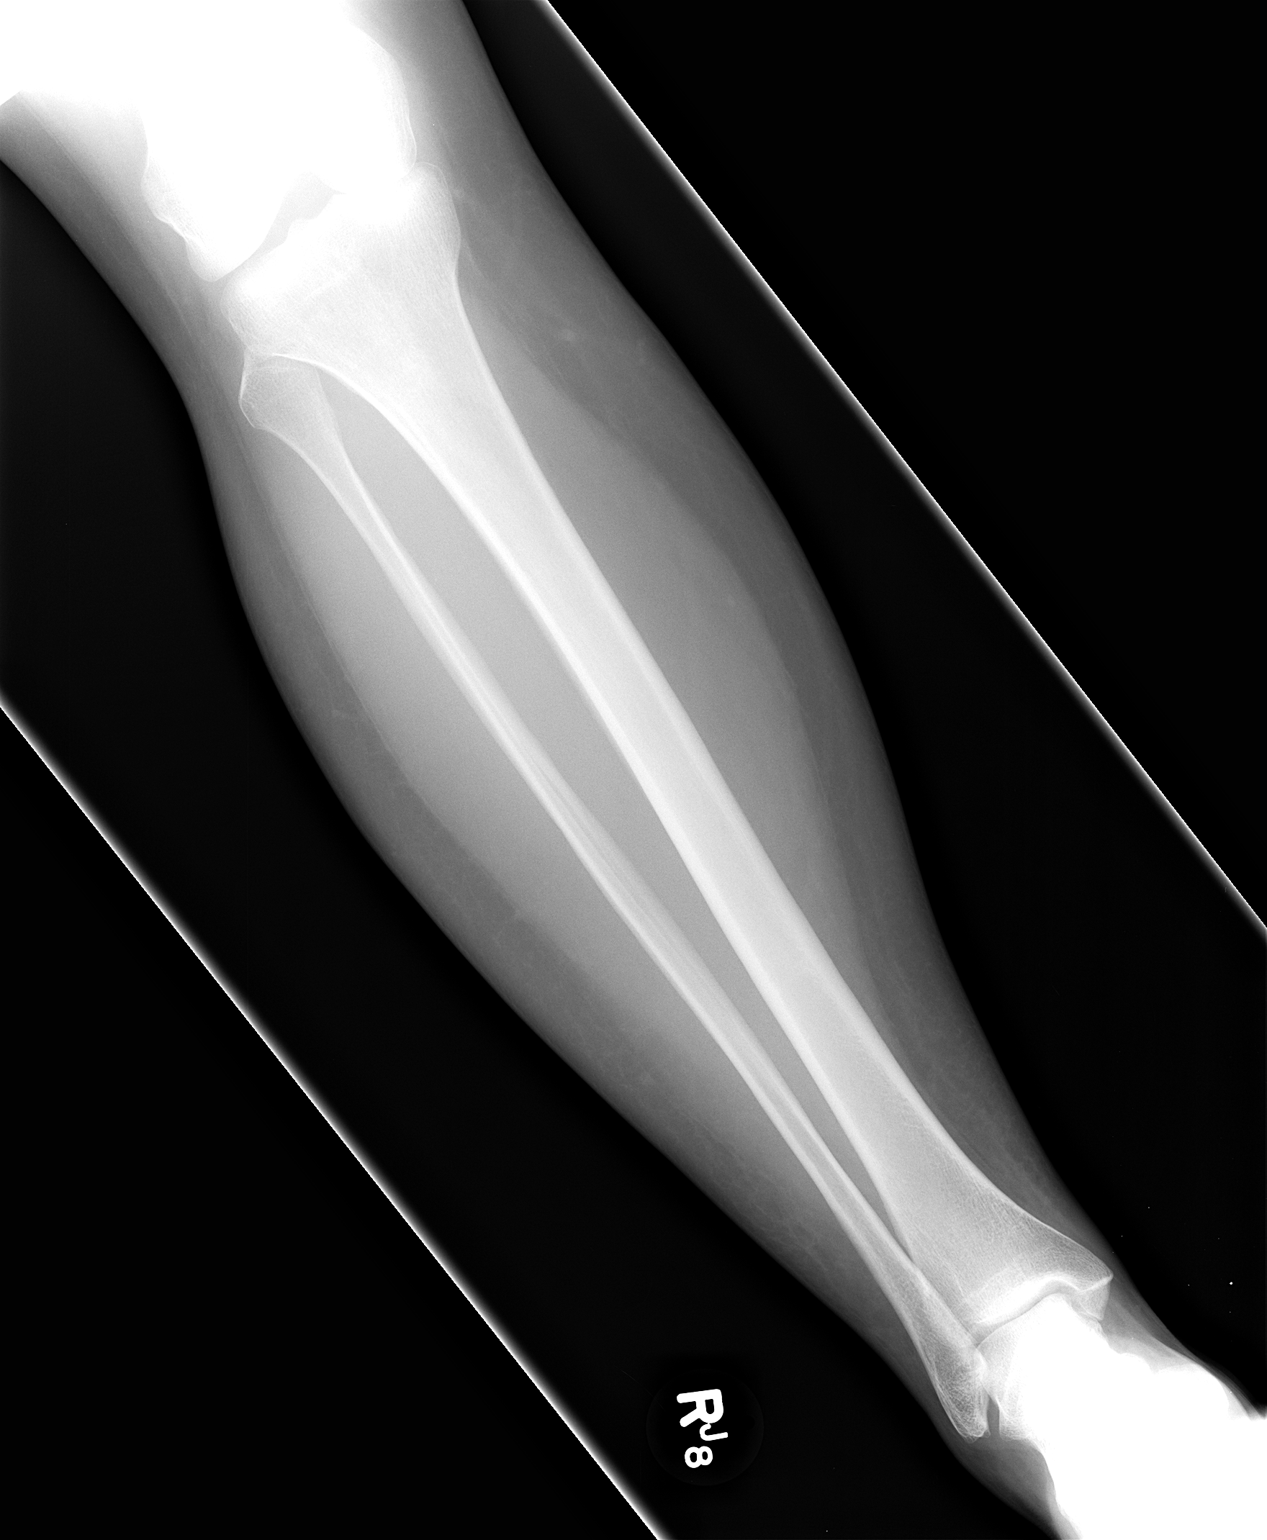

[view not recorded (2 of 2)]
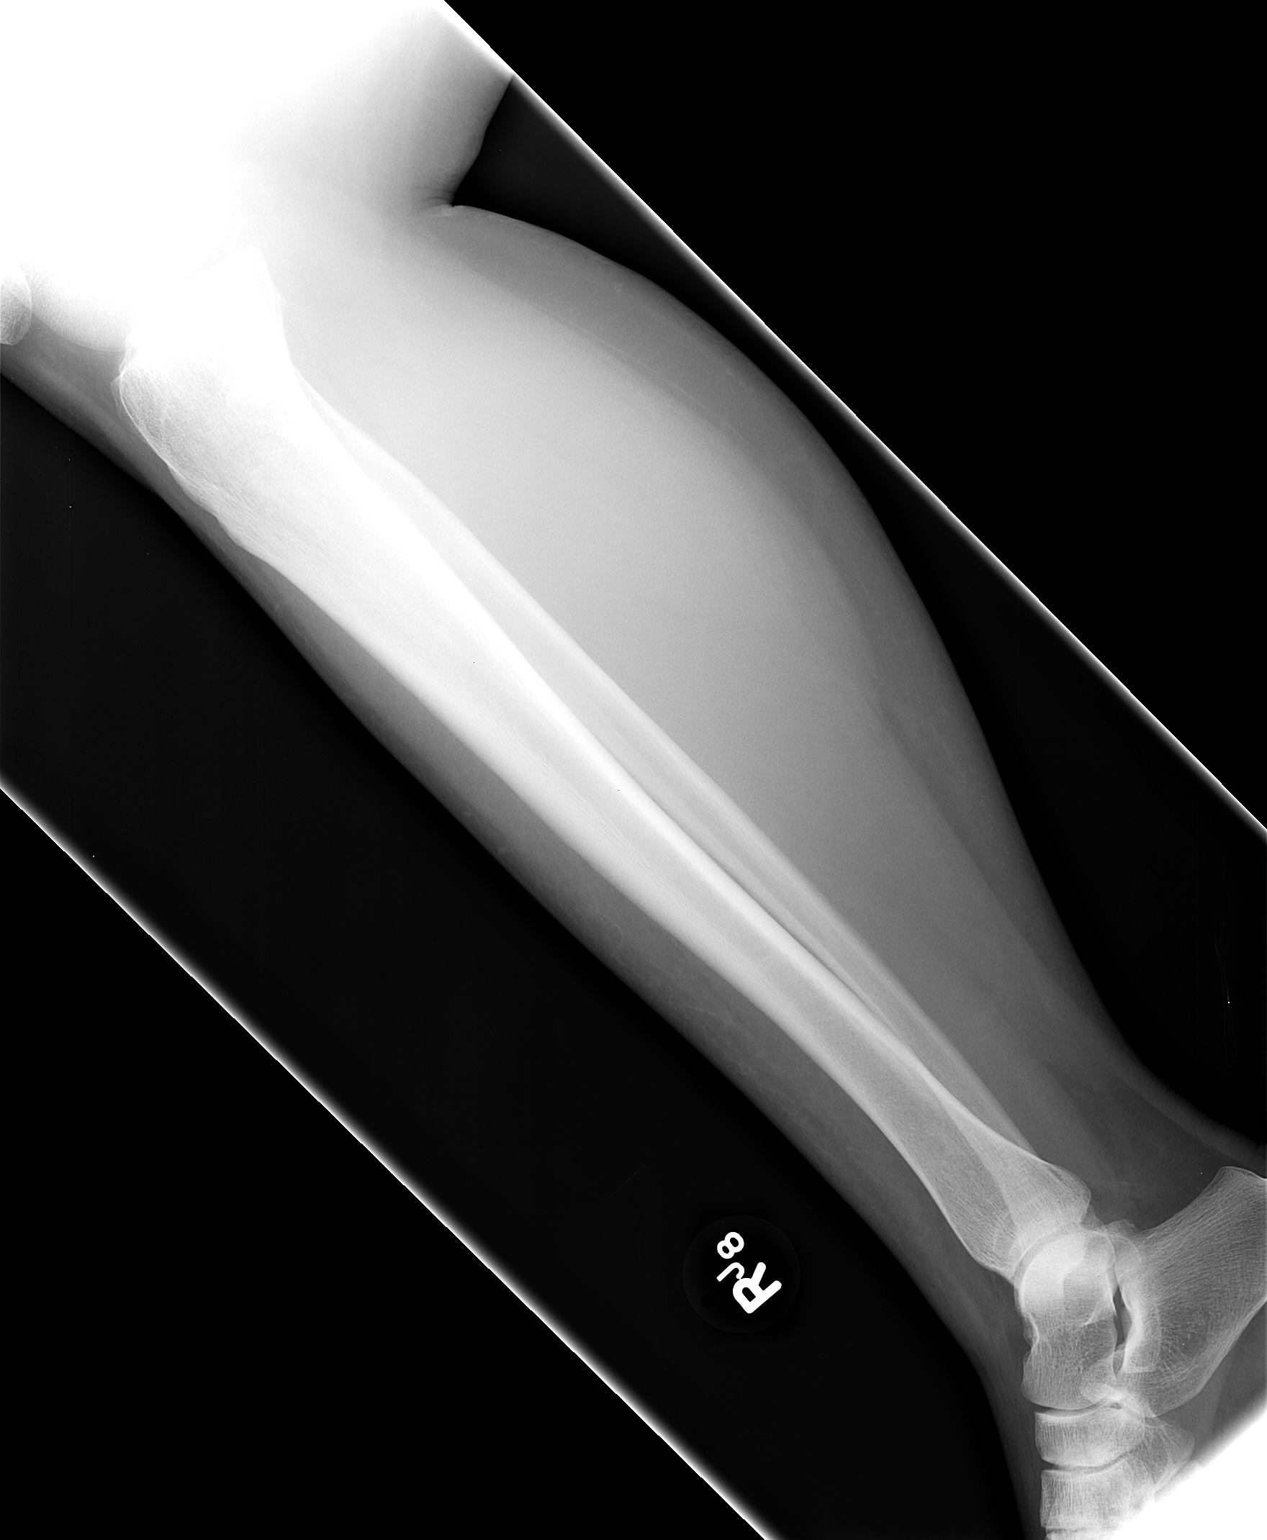

[2 of 2 positions shown; findings below may reference images not displayed]

FINDINGS: The right tibia and fibula are adequately mineralized. There is no
acute fracture nor dislocation. The soft tissues exhibit no foreign
bodies or abnormal gas collections. The observed portions of the
right knee and right ankle are unremarkable.
IMPRESSION: There is no acute bony abnormality of the right tibia or fibula.

## 2021-06-28 ENCOUNTER — Emergency Department (HOSPITAL_COMMUNITY)
Admission: EM | Admit: 2021-06-28 | Discharge: 2021-06-28 | Discharge status: Against Medical Advice | Payer: Self-pay | Source: Home / Self Care

## 2021-08-15 ENCOUNTER — Emergency Department (HOSPITAL_COMMUNITY)
Admission: EM | Admit: 2021-08-15 | Discharge: 2021-08-16 | Discharge status: Against Medical Advice | Payer: Medicaid - Out of State | Attending: Emergency Medicine | Admitting: Emergency Medicine

## 2021-08-15 ENCOUNTER — Encounter (HOSPITAL_COMMUNITY): Payer: Self-pay | Admitting: Emergency Medicine

## 2021-08-15 ENCOUNTER — Other Ambulatory Visit: Payer: Self-pay

## 2021-08-15 DIAGNOSIS — R1110 Vomiting, unspecified: Secondary | ICD-10-CM | POA: Insufficient documentation

## 2021-08-15 DIAGNOSIS — Z5321 Procedure and treatment not carried out due to patient leaving prior to being seen by health care provider: Secondary | ICD-10-CM | POA: Diagnosis not present

## 2021-08-15 DIAGNOSIS — R109 Unspecified abdominal pain: Secondary | ICD-10-CM | POA: Diagnosis not present

## 2021-08-15 DIAGNOSIS — R350 Frequency of micturition: Secondary | ICD-10-CM | POA: Insufficient documentation

## 2021-08-15 LAB — URINALYSIS, ROUTINE W REFLEX MICROSCOPIC
Bilirubin Urine: NEGATIVE
Glucose, UA: NEGATIVE mg/dL
Ketones, ur: NEGATIVE mg/dL
Nitrite: NEGATIVE
Protein, ur: NEGATIVE mg/dL
Specific Gravity, Urine: 1.018 (ref 1.005–1.030)
pH: 7 (ref 5.0–8.0)

## 2021-08-15 LAB — POC URINE PREG, ED: Preg Test, Ur: NEGATIVE

## 2021-08-15 NOTE — ED Triage Notes (Signed)
Pt c/o right-sided, non-radiating flank pain with urinary frequency and emesis starting today

## 2021-08-17 LAB — URINE CULTURE: Culture: 30000 — AB

## 2021-08-18 ENCOUNTER — Telehealth (HOSPITAL_BASED_OUTPATIENT_CLINIC_OR_DEPARTMENT_OTHER): Payer: Self-pay | Admitting: *Deleted

## 2021-08-18 NOTE — Telephone Encounter (Signed)
Post ED Visit - Positive Culture Follow-up  Culture report reviewed by antimicrobial stewardship pharmacist: [facility] []  , Pharm.D. []  Enzo Bi, Pharm.D., BCPS AQ-ID []  , Pharm.D., BCPS []  Celedonio Miyamoto, .D., BCPS []  [place], .D., BCPS, AAHIVP []  Georgina Pillion, Pharm.D., BCPS, AAHIVP []  [address], PharmD, BCPS []  , PharmD, BCPS []  [place], PharmD, BCPS []  [place], PharmD []  , PharmD, BCPS [x]  Estella Husk, PharmD  Pharmacy Team []  Lysle Pearl, PharmD []  , PharmD []  Phillips Climes, PharmD []  , Rph []  Agapito Games) , PharmD []  Verlan Friends, PharmD []  , PharmD []  Mervyn Gay, PharmD []  , PharmD []  Daylene Posey, PharmD []  [facility], PharmD []  , PharmD []  Len Childs, PharmD   Positive urine culture Do not treat per [facility] [place]  Greer Pickerel 08/18/2021, 10:34 AM

## 2021-12-03 ENCOUNTER — Other Ambulatory Visit: Payer: Self-pay | Admitting: Obstetrics & Gynecology

## 2021-12-03 DIAGNOSIS — O3680X0 Pregnancy with inconclusive fetal viability, not applicable or unspecified: Secondary | ICD-10-CM

## 2021-12-04 ENCOUNTER — Ambulatory Visit (INDEPENDENT_AMBULATORY_CARE_PROVIDER_SITE_OTHER): Payer: Medicaid Other

## 2021-12-04 DIAGNOSIS — Z3A10 10 weeks gestation of pregnancy: Secondary | ICD-10-CM

## 2021-12-04 DIAGNOSIS — O3680X0 Pregnancy with inconclusive fetal viability, not applicable or unspecified: Secondary | ICD-10-CM | POA: Diagnosis not present

## 2021-12-04 NOTE — Progress Notes (Signed)
[place] 10 wks,single IUP with yolk sac,CRL 30.73 mm,normal ovaries,FHR 169 bpm

## 2021-12-24 ENCOUNTER — Other Ambulatory Visit: Payer: Self-pay | Admitting: Obstetrics & Gynecology

## 2021-12-24 DIAGNOSIS — Z3682 Encounter for antenatal screening for nuchal translucency: Secondary | ICD-10-CM

## 2021-12-24 DIAGNOSIS — O0990 Supervision of high risk pregnancy, unspecified, unspecified trimester: Secondary | ICD-10-CM | POA: Insufficient documentation

## 2021-12-24 DIAGNOSIS — O09529 Supervision of elderly multigravida, unspecified trimester: Secondary | ICD-10-CM | POA: Insufficient documentation

## 2021-12-25 ENCOUNTER — Encounter: Payer: Self-pay | Admitting: Women's Health

## 2021-12-25 ENCOUNTER — Ambulatory Visit (INDEPENDENT_AMBULATORY_CARE_PROVIDER_SITE_OTHER): Payer: Medicaid Other

## 2021-12-25 ENCOUNTER — Encounter: Payer: Medicaid Other | Admitting: Women's Health

## 2021-12-25 ENCOUNTER — Ambulatory Visit (INDEPENDENT_AMBULATORY_CARE_PROVIDER_SITE_OTHER): Payer: Medicaid Other | Admitting: *Deleted

## 2021-12-25 VITALS — BP 114/73 | HR 75

## 2021-12-25 DIAGNOSIS — O09522 Supervision of elderly multigravida, second trimester: Secondary | ICD-10-CM

## 2021-12-25 DIAGNOSIS — O0992 Supervision of high risk pregnancy, unspecified, second trimester: Secondary | ICD-10-CM

## 2021-12-25 DIAGNOSIS — Z3A13 13 weeks gestation of pregnancy: Secondary | ICD-10-CM

## 2021-12-25 DIAGNOSIS — O09521 Supervision of elderly multigravida, first trimester: Secondary | ICD-10-CM

## 2021-12-25 DIAGNOSIS — O0991 Supervision of high risk pregnancy, unspecified, first trimester: Secondary | ICD-10-CM

## 2021-12-25 DIAGNOSIS — Z3682 Encounter for antenatal screening for nuchal translucency: Secondary | ICD-10-CM

## 2021-12-25 NOTE — Progress Notes (Addendum)
[place] 13 wks,measurements c/w dates,FHR 147 bpm,NB present,NT 1.5,posterior placenta,normal ovaries,CRL 65.82 mm

## 2021-12-26 ENCOUNTER — Encounter: Payer: Self-pay | Admitting: *Deleted

## 2021-12-26 NOTE — Progress Notes (Signed)
This encounter was created in error - please disregard. Pt did not keep appt, not seen by provider Roma Schanz, CNM, WHNP-BC 12/26/2021 9:05 AM

## 2021-12-26 NOTE — Progress Notes (Addendum)
   INITIAL OB NURSE INTAKE  SUBJECTIVE:  Victoria Savage is a 41 y.o. [id number] female [redacted]w[redacted]d by U/S at 10 weeks with an Estimated Date of Delivery: 07/02/22 being seen today for her initial OB intake/educational visit with RN. She is taking prenatal vitamins.  She is not having nausea and/or vomiting and does not request nausea meds at this time.  No LMP recorded (lmp unknown). Patient is pregnant.  Patient's medical, surgical, and obstetrical history obtained and reviewed.  Current medications reviewed.  Patient Active Problem List   Diagnosis Date Noted   Supervision of high-risk pregnancy 12/24/2021   AMA (advanced maternal age) multigravida 35+ 12/24/2021   Past Medical History:  Diagnosis Date   ADHD (attention deficit hyperactivity disorder)    Anemia    Hypoglycemia    No past surgical history on file. OB History     Gravida  5   Para  4   Term  4   Preterm      AB      Living  4      SAB      IAB      Ectopic      Multiple      Live Births  4           OBJECTIVE:  LMP  (LMP Unknown)   ASSESSMENT/PLAN: [id number] at [redacted]w[redacted]d with an Estimated Date of Delivery: 07/02/22  Prenatal vitamins: continue   Nausea medicines: not currently needed   OB packet given: Yes MyChart activated Blood Pressure Cuff: has at home. Discussed to be used for virtual visits and home BP checks.  Genetic & carrier screening discussed: requests Panorama and NT/IT, undecided about Horizon  Placed OB Box on problem list and updated Reviewed recommended weight gain based on pre-gravid BMI BMI >=30, gain max 11-20lb  Follow-up in 1 week for New OB visit with provider  Face-to-face time at least 30 minutes. 50% or more of this visit was spent in counseling and coordination of care.  Kristeen Miss Victoria Polhamus RN-C 12/26/2021 9:47 AM  Chart and initial new ob nurse intake visit note reviewed and agree with plan of care. [place], [facility] 12/31/2021 9:19 AM

## 2022-01-01 ENCOUNTER — Ambulatory Visit (INDEPENDENT_AMBULATORY_CARE_PROVIDER_SITE_OTHER): Payer: Medicaid Other | Admitting: Women's Health

## 2022-01-01 ENCOUNTER — Encounter: Payer: Self-pay | Admitting: Women's Health

## 2022-01-01 VITALS — BP 119/75 | HR 86 | Wt 187.6 lb

## 2022-01-01 DIAGNOSIS — Z131 Encounter for screening for diabetes mellitus: Secondary | ICD-10-CM

## 2022-01-01 DIAGNOSIS — Z3A14 14 weeks gestation of pregnancy: Secondary | ICD-10-CM | POA: Diagnosis not present

## 2022-01-01 DIAGNOSIS — Z1332 Encounter for screening for maternal depression: Secondary | ICD-10-CM | POA: Diagnosis not present

## 2022-01-01 DIAGNOSIS — O0992 Supervision of high risk pregnancy, unspecified, second trimester: Secondary | ICD-10-CM | POA: Diagnosis not present

## 2022-01-01 DIAGNOSIS — Z363 Encounter for antenatal screening for malformations: Secondary | ICD-10-CM | POA: Diagnosis not present

## 2022-01-01 DIAGNOSIS — Z1379 Encounter for other screening for genetic and chromosomal anomalies: Secondary | ICD-10-CM | POA: Diagnosis not present

## 2022-01-01 DIAGNOSIS — O09522 Supervision of elderly multigravida, second trimester: Secondary | ICD-10-CM | POA: Diagnosis not present

## 2022-01-01 DIAGNOSIS — Z3482 Encounter for supervision of other normal pregnancy, second trimester: Secondary | ICD-10-CM

## 2022-01-01 MED ORDER — DOXYLAMINE-PYRIDOXINE 10-10 MG PO TBEC: DELAYED_RELEASE_TABLET | ORAL | 6 refills | Status: DC

## 2022-01-01 MED ORDER — ASPIRIN 81 MG PO TBEC: 81.0000 mg | DELAYED_RELEASE_TABLET | Freq: Every day | ORAL | 3 refills | Status: DC

## 2022-01-01 NOTE — Progress Notes (Signed)
INITIAL OBSTETRICAL VISIT Patient name: Victoria Savage MRN OF:[id number]  Date of birth: 1980/08/17 Chief Complaint:   Initial Prenatal Visit  History of Present Illness:   Victoria Savage is a 41 y.o. G85P4004 Caucasian female at [redacted]w[redacted]d by [place] at 10 weeks with an Estimated Date of Delivery: 07/02/22 being seen today for her initial obstetrical visit.   No LMP recorded (lmp unknown). Patient is pregnant. Her obstetrical history is significant for  term SVB x 4, states last baby got stuck and they had to go in and get it, was her smallest baby, so unlikely a shoulder dystocia- baby born in [place] .   Today she reports nausea/dry heaves, requests rx  Last pap 06/26/21. Results were: negative per pt report at [facility]     01/01/2022    2:30 PM 12/25/2021   12:42 PM 12/25/2021   12:36 PM  Depression screen PHQ 2/9  Decreased Interest 1 1 1   Down, Depressed, Hopeless 3 3 3   PHQ - 2 Score 4 4 4   Altered sleeping 3 3 3   Tired, decreased energy 0 0 0  Change in appetite 0 0 0  Feeling bad or failure about yourself  0 0 0  Trouble concentrating 0 0 0  Moving slowly or fidgety/restless 0 0 0  Suicidal thoughts 0 0 0  PHQ-9 Score 7 7 7         01/01/2022    2:31 PM 12/25/2021   12:42 PM 12/25/2021   12:36 PM  GAD 7 : Generalized Anxiety Score  Nervous, Anxious, on Edge 0 0 0  Control/stop worrying 0 0 0  Worry too much - different things 0 0 0  Trouble relaxing 0 0 0  Restless 0 0 0  Easily annoyed or irritable 0 0 0  Afraid - awful might happen 0 0 0  Total GAD 7 Score 0 0 0     Review of Systems:   Pertinent items are noted in HPI Denies cramping/contractions, leakage of fluid, vaginal bleeding, abnormal vaginal discharge w/ itching/odor/irritation, headaches, visual changes, shortness of breath, chest pain, abdominal pain, severe nausea/vomiting, or problems with urination or bowel movements unless otherwise stated above.  Pertinent History Reviewed:  Reviewed past  medical,surgical, social, obstetrical and family history.  Reviewed problem list, medications and allergies. OB History  Gravida Para Term Preterm AB Living  5 4 4     4   SAB IAB Ectopic Multiple Live Births          4    # Outcome Date GA Lbr Len/2nd Weight Sex Delivery Anes PTL Lv  5 Current           4 Term 04/21/15 [redacted]w[redacted]d  6 lb 12 oz (3.062 kg) M Vag-Spont None N LIV     Birth Comments: System Generated. Please review and update pregnancy details.  3 Term 12/10/12 [redacted]w[redacted]d  7 lb 14 oz (3.572 kg) M Vag-Spont EPI N LIV  2 Term 07/28/09 [redacted]w[redacted]d  7 lb 14 oz (3.572 kg) M Vag-Spont EPI N LIV  1 Term 07/28/05 [redacted]w[redacted]d  8 lb 1 oz (3.657 kg) F Vag-Spont EPI N LIV   Physical Assessment:   Vitals:   01/01/22 1426  BP: 119/75  Pulse: 86  Weight: 187 lb 9.6 oz (85.1 kg)  Body mass index is 34.31 kg/m.       Physical Examination:  General appearance - well appearing, and in no distress  Mental status - alert, oriented to person, place, and  time  Psych:  She has a normal mood and affect  Skin - warm and dry, normal color, no suspicious lesions noted  Chest - effort normal, all lung fields clear to auscultation bilaterally  Heart - normal rate and regular rhythm  Abdomen - soft, nontender  Extremities:  No swelling or varicosities noted  Thin prep pap is not done   Chaperone: N/A    No results found for this or any previous visit (from the past 24 hour(s)).  Assessment & Plan:  1) High-Risk Pregnancy G5P4004 at [redacted]w[redacted]d with an Estimated Date of Delivery: 07/02/22   2) Initial OB visit  3) AMA 41yo> start ASA 81mg   4) Nausea> rx diclegis  Meds:  Meds ordered this encounter  Medications   aspirin EC 81 MG tablet    Sig: Take 1 tablet (81 mg total) by mouth daily. Swallow whole.    Dispense:  90 tablet    Refill:  3    Order Specific Question:   Supervising Provider    Answer:   Tania Ade H [[id number]]   Doxylamine-Pyridoxine (DICLEGIS) 10-10 MG TBEC    Sig: 2 tabs q hs, if sx persist  add 1 tab q am on day 3, if sx persist add 1 tab q afternoon on day 4    Dispense:  100 tablet    Refill:  6    Order Specific Question:   Supervising Provider    Answer:   Tania Ade H [[id number]]    Initial labs obtained Continue prenatal vitamins Reviewed n/v relief measures and warning s/s to report Reviewed recommended weight gain based on pre-gravid BMI Encouraged well-balanced diet Genetic & carrier screening discussed: requests Panorama, NT/IT, and Horizon  Ultrasound discussed; fetal survey: requested CCNC completed> form faxed if has or is planning to apply for medicaid The nature of [place] for [employer] with multiple MDs and other Advanced Practice Providers was explained to patient; also emphasized that fellows, residents, and students are part of our team.  Indications for early A1C (per uptodate) BMI >=25 (>=23 in Asian women) AND one of the following >= 40yo Yes  Follow-up: Return in about 5 weeks (around 02/05/2022) for HROB, [place]:HCWCBJS, MD or CNM, in person; get pap & 2017 delivery note.   Orders Placed This Encounter  Procedures   Urine Culture   GC/Chlamydia Probe Amp   US OB Comp + 14 Wk   CBC/D/Plt+RPR+Rh+ABO+RubIgG...   Integrated 1   Panorama Prenatal Test Full Panel   HORIZON Custom   Hemoglobin A1c    Roma Schanz CNM, [facility] 01/01/2022 3:20 PM

## 2022-01-01 NOTE — Patient Instructions (Signed)
Apryl, thank you for choosing our office today! We appreciate the opportunity to meet your healthcare needs. You may receive a short survey by mail, e-mail, or through MyChart. If you are happy with your care we would appreciate if you could take just a few minutes to complete the survey questions. We read all of your comments and take your feedback very seriously. Thank you again for choosing our office.  [facility] ([address] [place], [place] [address]) Entrance C, located off of [address] Free 24/7 valet parking   Nausea & Vomiting Have saltine crackers or pretzels by your bed and eat a few bites before you raise your head out of bed in the morning Eat small frequent meals throughout the day instead of large meals Drink plenty of fluids throughout the day to stay hydrated, just don't drink a lot of fluids with your meals.  This can make your stomach fill up faster making you feel sick Do not brush your teeth right after you eat Products with real ginger are good for nausea, like ginger ale and ginger hard candy Make sure it says made with real ginger! Sucking on sour candy like lemon heads is also good for nausea If your prenatal vitamins make you nauseated, take them at night so you will sleep through the nausea Sea Bands If you feel like you need medicine for the nausea & vomiting please let us know If you are unable to keep any fluids or food down please let us know   Constipation Drink plenty of fluid, preferably water, throughout the day Eat foods high in fiber such as fruits, vegetables, and grains Exercise, such as walking, is a good way to keep your bowels regular Drink warm fluids, especially warm prune juice, or decaf coffee Eat a 1/2 cup of real oatmeal (not instant), 1/2 cup applesauce, and 1/2-1 cup warm prune juice every day If needed, you may take Colace (docusate sodium) stool softener  once or twice a day to help keep the stool soft.  If you still are having problems with constipation, you may take Miralax once daily as needed to help keep your bowels regular.   Home Blood Pressure Monitoring for Patients   Your provider has recommended that you check your blood pressure (BP) at least once a week at home. If you do not have a blood pressure cuff at home, one will be provided for you. Contact your provider if you have not received your monitor within 1 week.   Helpful Tips for Accurate Home Blood Pressure Checks  Don't smoke, exercise, or drink caffeine 30 minutes before checking your BP Use the restroom before checking your BP (a full bladder can raise your pressure) Relax in a comfortable upright chair Feet on the ground Left arm resting comfortably on a flat surface at the level of your heart Legs uncrossed Back supported Sit quietly and don't talk Place the cuff on your bare arm Adjust snuggly, so that only two fingertips can fit between your skin and the top of the cuff Check 2 readings separated by at least one minute Keep a log of your BP readings For a visual, please reference this diagram: http://ccnc.care/bpdiagram  Provider Name: [facility]     Phone: [phone]  Zone 1: ALL CLEAR  Continue to monitor your symptoms:  BP reading is less than 140 (top number) or less than 90 (bottom   number)  No right upper stomach pain No headaches or seeing spots No feeling nauseated or throwing up No swelling in face and hands  Zone 2: CAUTION Call your doctor's office for any of the following:  BP reading is greater than 140 (top number) or greater than 90 (bottom number)  Stomach pain under your ribs in the middle or right side Headaches or seeing spots Feeling nauseated or throwing up Swelling in face and hands  Zone 3: EMERGENCY  Seek immediate medical care if you have any of the following:  BP reading is greater than160 (top number) or greater than  110 (bottom number) Severe headaches not improving with Tylenol Serious difficulty catching your breath Any worsening symptoms from Zone 2   Second Trimester of Pregnancy  The second trimester of pregnancy is from week 13 through week 27. This is months 4 through 6 of pregnancy. The second trimester is often a time when you feel your best. Your body has adjusted to being pregnant, and you begin to feel better physically. During the second trimester: Morning sickness has lessened or stopped completely. You may have more energy. You may have an increase in appetite. The second trimester is also a time when the unborn baby (fetus) is growing rapidly. At the end of the sixth month, the fetus may be up to 12 inches long and weigh about 1 pounds. You will likely begin to feel the baby move (quickening) between 16 and 20 weeks of pregnancy. Body changes during your second trimester Your body continues to go through many changes during your second trimester. The changes vary and generally return to normal after the baby is born. Physical changes Your weight will continue to increase. You will notice your lower abdomen bulging out. You may begin to get stretch marks on your hips, abdomen, and breasts. Your breasts will continue to grow and to become tender. Dark spots or blotches (chloasma or mask of pregnancy) may develop on your face. A dark line from your belly button to the pubic area (linea nigra) may appear. You may have changes in your hair. These can include thickening of your hair, rapid growth, and changes in texture. Some people also have hair loss during or after pregnancy, or hair that feels dry or thin. Health changes You may develop headaches. You may have heartburn. You may develop constipation. You may develop hemorrhoids or swollen, bulging veins (varicose veins). Your gums may bleed and may be sensitive to brushing and flossing. You may urinate more often because the fetus is  pressing on your bladder. You may have back pain. This is caused by: Weight gain. Pregnancy hormones that are relaxing the joints in your pelvis. A shift in weight and the muscles that support your balance. Follow these instructions at home: Medicines Follow your health care provider's instructions regarding medicine use. Specific medicines may be either safe or unsafe to take during pregnancy. Do not take any medicines unless approved by your health care provider. Take a prenatal vitamin that contains at least 600 micrograms (mcg) of folic acid. Eating and drinking Eat a healthy diet that includes fresh fruits and vegetables, whole grains, good sources of protein such as meat, eggs, or tofu, and low-fat dairy products. Avoid raw meat and unpasteurized juice, milk, and cheese. These carry germs that can harm you and your baby. You may need to take these actions to prevent or treat constipation: Drink enough fluid to keep your urine pale yellow. Eat foods that are high  in fiber, such as beans, whole grains, and fresh fruits and vegetables. Limit foods that are high in fat and processed sugars, such as fried or sweet foods. Activity Exercise only as directed by your health care provider. Most people can continue their usual exercise routine during pregnancy. Try to exercise for 30 minutes at least 5 days a week. Stop exercising if you develop contractions in your uterus. Stop exercising if you develop pain or cramping in the lower abdomen or lower back. Avoid exercising if it is very hot or humid or if you are at a high altitude. Avoid heavy lifting. If you choose to, you may have sex unless your health care provider tells you not to. Relieving pain and discomfort Wear a supportive bra to prevent discomfort from breast tenderness. Take warm sitz baths to soothe any pain or discomfort caused by hemorrhoids. Use hemorrhoid cream if your health care provider approves. Rest with your legs raised  (elevated) if you have leg cramps or low back pain. If you develop varicose veins: Wear support hose as told by your health care provider. Elevate your feet for 15 minutes, 3-4 times a day. Limit salt in your diet. Safety Wear your seat belt at all times when driving or riding in a car. Talk with your health care provider if someone is verbally or physically abusive to you. Lifestyle Do not use hot tubs, steam rooms, or saunas. Do not douche. Do not use tampons or scented sanitary pads. Avoid cat litter boxes and soil used by cats. These carry germs that can cause birth defects in the baby and possibly loss of the fetus by miscarriage or stillbirth. Do not use herbal remedies, alcohol, illegal drugs, or medicines that are not approved by your health care provider. Chemicals in these products can harm your baby. Do not use any products that contain nicotine or tobacco, such as cigarettes, e-cigarettes, and chewing tobacco. If you need help quitting, ask your health care provider. General instructions During a routine prenatal visit, your health care provider will do a physical exam and other tests. He or she will also discuss your overall health. Keep all follow-up visits. This is important. Ask your health care provider for a referral to a local prenatal education class. Ask for help if you have counseling or nutritional needs during pregnancy. Your health care provider can offer advice or refer you to specialists for help with various needs. Where to find more information American Pregnancy Association: americanpregnancy.[place] and Gynecologists: PoolDevices.com.pt Office on [employer] Health: KeywordPortfolios.com.br Contact a health care provider if you have: A headache that does not go away when you take medicine. Vision changes or you see spots in front of your eyes. Mild pelvic cramps, pelvic pressure, or nagging pain in the abdominal  area. Persistent nausea, vomiting, or diarrhea. A bad-smelling vaginal discharge or foul-smelling urine. Pain when you urinate. Sudden or extreme swelling of your face, hands, ankles, feet, or legs. A fever. Get help right away if you: Have fluid leaking from your vagina. Have spotting or bleeding from your vagina. Have severe abdominal cramping or pain. Have difficulty breathing. Have chest pain. Have fainting spells. Have not felt your baby move for the time period told by your health care provider. Have new or increased pain, swelling, or redness in an arm or leg. Summary The second trimester of pregnancy is from week 13 through week 27 (months 4 through 6). Do not use herbal remedies, alcohol, illegal drugs, or medicines  that are not approved by your health care provider. Chemicals in these products can harm your baby. Exercise only as directed by your health care provider. Most people can continue their usual exercise routine during pregnancy. Keep all follow-up visits. This is important. This information is not intended to replace advice given to you by your health care provider. Make sure you discuss any questions you have with your health care provider. Document Revised: 08/04/2019 Document Reviewed: 06/10/2019 Elsevier Patient Education  [place].

## 2022-01-02 LAB — CBC/D/PLT+RPR+RH+ABO+RUBIGG...
Hemoglobin: 11 g/dL — ABNORMAL LOW (ref 11.1–15.9)
Immature Grans (Abs): 0 10*3/uL (ref 0.0–0.1)
Immature Granulocytes: 0 %
Lymphs: 24 %
MCH: 27.4 pg (ref 26.6–33.0)
Monocytes Absolute: 0.6 10*3/uL (ref 0.1–0.9)
Neutrophils Absolute: 8.4 10*3/uL — ABNORMAL HIGH (ref 1.4–7.0)
Neutrophils: 71 %
Platelets: 300 10*3/uL (ref 150–450)
RBC: 4.02 x10E6/uL (ref 3.77–5.28)
WBC: 11.8 10*3/uL — ABNORMAL HIGH (ref 3.4–10.8)

## 2022-01-02 LAB — INTEGRATED 1

## 2022-01-02 LAB — HEMOGLOBIN A1C
Est. average glucose Bld gHb Est-mCnc: 103 mg/dL
Hgb A1c MFr Bld: 5.2 % (ref 4.8–5.6)

## 2022-01-03 LAB — CBC/D/PLT+RPR+RH+ABO+RUBIGG...
Antibody Screen: NEGATIVE
Basophils Absolute: 0 10*3/uL (ref 0.0–0.2)
Basos: 0 %
EOS (ABSOLUTE): 0 10*3/uL (ref 0.0–0.4)
Eos: 0 %
HCV Ab: NONREACTIVE
HIV Screen 4th Generation wRfx: NONREACTIVE
Hematocrit: 33.9 % — ABNORMAL LOW (ref 34.0–46.6)
Hepatitis B Surface Ag: NEGATIVE
Lymphocytes Absolute: 2.8 10*3/uL (ref 0.7–3.1)
MCHC: 32.4 g/dL (ref 31.5–35.7)
MCV: 84 fL (ref 79–97)
Monocytes: 5 %
RDW: 13.2 % (ref 11.7–15.4)
RPR Ser Ql: NONREACTIVE
Rh Factor: POSITIVE
Rubella Antibodies, IGG: 2.03 index (ref >0.99)

## 2022-01-03 LAB — INTEGRATED 1
Crown Rump Length: 65.8 mm
Number of Fetuses: 1
PAPP-A Value: 1107.5 ng/mL

## 2022-01-03 LAB — URINE CULTURE

## 2022-01-03 LAB — GC/CHLAMYDIA PROBE AMP
Chlamydia trachomatis, NAA: NEGATIVE
Neisseria Gonorrhoeae by PCR: NEGATIVE

## 2022-01-03 LAB — HCV INTERPRETATION

## 2022-01-07 LAB — PANORAMA PRENATAL TEST FULL PANEL:PANORAMA TEST PLUS 5 ADDITIONAL MICRODELETIONS: FETAL FRACTION: 7.5

## 2022-01-08 ENCOUNTER — Other Ambulatory Visit: Payer: Self-pay | Admitting: Women's Health

## 2022-01-08 NOTE — Progress Notes (Signed)
Records received from [facility], last delivery in 2017, no shoulder dystocia per delivery note. Last pap 05/13/16 NILM.

## 2022-01-17 LAB — HORIZON CUSTOM: REPORT SUMMARY: NEGATIVE

## 2022-02-07 ENCOUNTER — Ambulatory Visit (INDEPENDENT_AMBULATORY_CARE_PROVIDER_SITE_OTHER): Payer: Medicaid Other | Admitting: Obstetrics & Gynecology

## 2022-02-07 ENCOUNTER — Other Ambulatory Visit (HOSPITAL_COMMUNITY)
Admission: RE | Admit: 2022-02-07 | Discharge: 2022-02-07 | Disposition: A | Discharge status: Home / Self Care | Payer: Medicaid Other | Source: Ambulatory Visit | Attending: Obstetrics & Gynecology | Admitting: Obstetrics & Gynecology

## 2022-02-07 ENCOUNTER — Ambulatory Visit (INDEPENDENT_AMBULATORY_CARE_PROVIDER_SITE_OTHER): Payer: Medicaid Other

## 2022-02-07 VITALS — BP 101/65 | HR 91 | Wt 192.5 lb

## 2022-02-07 DIAGNOSIS — Z363 Encounter for antenatal screening for malformations: Secondary | ICD-10-CM

## 2022-02-07 DIAGNOSIS — O09522 Supervision of elderly multigravida, second trimester: Secondary | ICD-10-CM | POA: Diagnosis not present

## 2022-02-07 DIAGNOSIS — Z124 Encounter for screening for malignant neoplasm of cervix: Secondary | ICD-10-CM | POA: Insufficient documentation

## 2022-02-07 DIAGNOSIS — Z3A19 19 weeks gestation of pregnancy: Secondary | ICD-10-CM

## 2022-02-07 DIAGNOSIS — O0992 Supervision of high risk pregnancy, unspecified, second trimester: Secondary | ICD-10-CM

## 2022-02-07 NOTE — Progress Notes (Signed)
HIGH-RISK PREGNANCY VISIT Patient name: Victoria Savage MRN [id number]  Date of birth: 1980/04/27 Chief Complaint:   Routine Prenatal Visit  History of Present Illness:   Victoria Savage is a 41 y.o. G65P4004 female at [redacted]w[redacted]d with an Estimated Date of Delivery: 07/02/22 being seen today for ongoing management of a high-risk pregnancy complicated by advanced maternal age >=40yo.    Today she reports no complaints. Nausea/vomiting improved with current medication  Contractions: Not present. Vag. Bleeding: None.  Not yet feeling fetal movement . denies leaking of fluid.      01/01/2022    2:30 PM 12/25/2021   12:42 PM 12/25/2021   12:36 PM  Depression screen PHQ 2/9  Decreased Interest 1 1 1   Down, Depressed, Hopeless 3 3 3   PHQ - 2 Score 4 4 4   Altered sleeping 3 3 3   Tired, decreased energy 0 0 0  Change in appetite 0 0 0  Feeling bad or failure about yourself  0 0 0  Trouble concentrating 0 0 0  Moving slowly or fidgety/restless 0 0 0  Suicidal thoughts 0 0 0  PHQ-9 Score 7 7 7      Current Outpatient Medications  Medication Instructions   aspirin EC 81 mg, Oral, Daily, Swallow whole.   Doxylamine-Pyridoxine (DICLEGIS) 10-10 MG TBEC 2 tabs q hs, if sx persist add 1 tab q am on day 3, if sx persist add 1 tab q afternoon on day 4   Prenatal Vit-Fe Fumarate-FA (PRENATAL VITAMIN PO) Oral     Review of Systems:   Pertinent items are noted in HPI Denies abnormal vaginal discharge w/ itching/odor/irritation, headaches, visual changes, shortness of breath, chest pain, abdominal pain, severe nausea/vomiting, or problems with urination or bowel movements unless otherwise stated above. Pertinent History Reviewed:  Reviewed past medical,surgical, social, obstetrical and family history.  Reviewed problem list, medications and allergies. Physical Assessment:   Vitals:   02/07/22 1042  BP: 101/65  Pulse: 91  Weight: 192 lb 8 oz (87.3 kg)  Body mass index is 35.21 kg/m.            Physical Examination:   General appearance: alert, well appearing, and in no distress  Mental status: normal mood, behavior, speech, dress, motor activity, and thought processes  Skin: warm & dry   Extremities: Edema: None    Cardiovascular: normal heart rate noted  Respiratory: normal respiratory effort, no distress  Abdomen: gravid, soft, non-tender  Pelvic:  Normal external genital, vaginal pink mucosa, cervix closed Pap collected          Fetal Status:          Fetal Surveillance Testing today: breech,cx 4.4 cm,posterior placenta gr 0,normal ovaries,FHR 152 bpm,SVP of fluid 5.4 cm,EFW 308 g 69%,anatomy complete,no obvious abnormalities     Chaperone:  pt declined     No results found for this or any previous visit (from the past 24 hour(s)).   Assessment & Plan:  High-risk pregnancy: G5P4004 at [redacted]w[redacted]d with an Estimated Date of Delivery: 07/02/22   1) AMA [] next growth @ 32wks Normal anatomy scan today  -not interested in permanent sterilization  Meds: No orders of the defined types were placed in this encounter.   Labs/procedures today: anatomy scan, IT-2  Treatment Plan:  continue routine OB care  Reviewed: Preterm labor symptoms and general obstetric precautions including but not limited to vaginal bleeding, contractions, leaking of fluid and fetal movement were reviewed in detail with the patient.  All questions were answered.  Follow-up: Return in about 4 weeks (around 03/07/2022) for [place] visit ([place] for CNM).   Future Appointments  Date Time Provider [place]  03/07/2022 11:50 AM Eure, Mertie Clause, MD [facility]    Orders Placed This Encounter  Procedures   INTEGRATED 2    Janyth Pupa, DO Attending [place], [facility] for [facility], [place]

## 2022-02-07 NOTE — Progress Notes (Signed)
[place] 19+2 wks,breech,cx 4.4 cm,posterior placenta gr 0,normal ovaries,FHR 152 bpm,SVP of fluid 5.4 cm,EFW 308 g 69%,anatomy complete,no obvious abnormalities

## 2022-02-08 LAB — CYTOLOGY - PAP
Comment: NEGATIVE
Diagnosis: NEGATIVE
High risk HPV: NEGATIVE

## 2022-02-09 LAB — INTEGRATED 2
AFP MoM: 1.94
Alpha-Fetoprotein: 78.2 ng/mL
Crown Rump Length: 65.8 mm
DIA MoM: 1.19
DIA Value: 168.5 pg/mL
Estriol, Unconjugated: 2.65 ng/mL
Gest. Age on Collection Date: 13.7 weeks
Gestational Age: 19 weeks
Maternal Age at EDD: 41.1 yr
Nuchal Translucency (NT): 1.5 mm
Nuchal Translucency MoM: 1.01
Number of Fetuses: 1
PAPP-A MoM: 0.94
PAPP-A Value: 1107.5 ng/mL
Test Results:: NEGATIVE
Weight: 188 [lb_av]
Weight: 188 [lb_av]
hCG MoM: 1.3
hCG Value: 27.1 IU/mL
uE3 MoM: 1.58

## 2022-03-07 ENCOUNTER — Encounter: Payer: Self-pay | Admitting: Obstetrics & Gynecology

## 2022-03-07 ENCOUNTER — Ambulatory Visit (INDEPENDENT_AMBULATORY_CARE_PROVIDER_SITE_OTHER): Payer: Medicaid Other | Admitting: Obstetrics & Gynecology

## 2022-03-07 VITALS — BP 111/77 | HR 101 | Wt 195.0 lb

## 2022-03-07 DIAGNOSIS — O0992 Supervision of high risk pregnancy, unspecified, second trimester: Secondary | ICD-10-CM

## 2022-03-07 DIAGNOSIS — O09522 Supervision of elderly multigravida, second trimester: Secondary | ICD-10-CM

## 2022-03-07 DIAGNOSIS — Z3A23 23 weeks gestation of pregnancy: Secondary | ICD-10-CM

## 2022-03-07 MED ORDER — OMEPRAZOLE 20 MG PO CPDR: 20.0000 mg | DELAYED_RELEASE_CAPSULE | Freq: Every day | ORAL | 6 refills | Status: AC

## 2022-03-07 NOTE — Progress Notes (Signed)
HIGH-RISK PREGNANCY VISIT Patient name: Victoria Savage MRN [id number]  Date of birth: 29-Oct-1980 Chief Complaint:   Routine Prenatal Visit (Complains of trouble sleeping )  History of Present Illness:   Victoria Savage is a 41 y.o. G10P4004 female at [redacted]w[redacted]d with an Estimated Date of Delivery: 07/02/22 being seen today for ongoing management of a high-risk pregnancy complicated by advanced maternal age >=40yo.    Today she reports no complaints. Contractions: Not present. Vag. Bleeding: None.  Movement: Present. denies leaking of fluid.      01/01/2022    2:30 PM 12/25/2021   12:42 PM 12/25/2021   12:36 PM  Depression screen PHQ 2/9  Decreased Interest 1 1 1   Down, Depressed, Hopeless 3 3 3   PHQ - 2 Score 4 4 4   Altered sleeping 3 3 3   Tired, decreased energy 0 0 0  Change in appetite 0 0 0  Feeling bad or failure about yourself  0 0 0  Trouble concentrating 0 0 0  Moving slowly or fidgety/restless 0 0 0  Suicidal thoughts 0 0 0  PHQ-9 Score 7 7 7         01/01/2022    2:31 PM 12/25/2021   12:42 PM 12/25/2021   12:36 PM  GAD 7 : Generalized Anxiety Score  Nervous, Anxious, on Edge 0 0 0  Control/stop worrying 0 0 0  Worry too much - different things 0 0 0  Trouble relaxing 0 0 0  Restless 0 0 0  Easily annoyed or irritable 0 0 0  Afraid - awful might happen 0 0 0  Total GAD 7 Score 0 0 0     Review of Systems:   Pertinent items are noted in HPI Denies abnormal vaginal discharge w/ itching/odor/irritation, headaches, visual changes, shortness of breath, chest pain, abdominal pain, severe nausea/vomiting, or problems with urination or bowel movements unless otherwise stated above. Pertinent History Reviewed:  Reviewed past medical,surgical, social, obstetrical and family history.  Reviewed problem list, medications and allergies. Physical Assessment:   Vitals:   03/07/22 1210  BP: 111/77  Pulse: (!) 101  Weight: 195 lb (88.5 kg)  Body mass index is 35.67 kg/m.            Physical Examination:   General appearance: alert, well appearing, and in no distress  Mental status: alert, oriented to person, place, and time  Skin: warm & dry   Extremities: Edema: None    Cardiovascular: normal heart rate noted  Respiratory: normal respiratory effort, no distress  Abdomen: gravid, soft, non-tender  Pelvic: Cervical exam deferred         Fetal Status:     Movement: Present    Fetal Surveillance Testing today: FHR 122   Chaperone: N/A    No results found for this or any previous visit (from the past 24 hour(s)).  Assessment & Plan:  High-risk pregnancy: G5P4004 at [redacted]w[redacted]d with an Estimated Date of Delivery: 07/02/22      ICD-10-CM   1. Supervision of high risk pregnancy in second trimester  O09.92     2. Multigravida of advanced maternal age in second trimester  O09.522    sonogram + PN2 next visit       Meds:  Meds ordered this encounter  Medications   omeprazole (PRILOSEC) 20 MG capsule    Sig: Take 1 capsule (20 mg total) by mouth daily. 1 tablet a day    Dispense:  30 capsule    Refill:  6  Orders: No orders of the defined types were placed in this encounter.    Labs/procedures today:   Treatment Plan:  PN2 + sonogram  Reviewed: Preterm labor symptoms and general obstetric precautions including but not limited to vaginal bleeding, contractions, leaking of fluid and fetal movement were reviewed in detail with the patient.  All questions were answered. Does have home bp cuff. Office bp cuff given: not applicable. Check bp weekly, let us know if consistently >140 and/or >90.  Follow-up: Return in about 4 weeks (around 04/04/2022) for PN2, OB sonogram for growth.   No future appointments.  No orders of the defined types were placed in this encounter.  Lazaro Arms  Attending Physician for the Center for [facility] 03/07/2022 12:24 PM

## 2022-03-11 NOTE — L&D Delivery Note (Signed)
OB/GYN Faculty Practice Delivery Note  Victoria Savage is a 42 y.o. [id number] s/p VD at [redacted]w[redacted]d. She was admitted for IOL for A2GDM.   ROM: 4h 26m with clear fluid GBS Status:  Negative/-- (04/02 1330) Maximum Maternal Temperature: 97.9  Labor Progress: Initial SVE: 4.5/70/-1. Augmented with AROM and pitocin. She then progressed to complete.   Delivery Date/Time: 06/25/22 1451 Delivery: Called to room and patient was complete and pushing. Head delivered LOA. Nuchal cord x1, loose, reduced without difficulty. Shoulder and body delivered in usual fashion. Infant with spontaneous cry, placed on mother's abdomen, dried and stimulated. Cord clamped x 2 after 1-minute delay, and cut by FOB. Cord blood drawn. Placenta delivered spontaneously with gentle cord traction. Fundus firm with massage and Pitocin. Labia, perineum, vagina, and cervix inspected with 1st degree laceration, no repair.  Baby Weight: pending  Placenta: 3 vessel, intact. Sent to L&D Complications: None Lacerations: 1st degree, no repair EBL: 203 mL Analgesia: Epidural   Infant:  APGAR (1 MIN):  6 APGAR (5 MINS):  9  Vonna Drafts, MD FM PGY1, Faculty Practice Center for [employer], [facility] 06/25/2022, 3:19 PM

## 2022-04-04 ENCOUNTER — Encounter: Payer: Medicaid Other | Admitting: Advanced Practice Midwife

## 2022-04-04 ENCOUNTER — Other Ambulatory Visit: Payer: Medicaid Other

## 2022-04-04 ENCOUNTER — Other Ambulatory Visit: Payer: Self-pay | Admitting: Obstetrics & Gynecology

## 2022-04-04 DIAGNOSIS — O09522 Supervision of elderly multigravida, second trimester: Secondary | ICD-10-CM

## 2022-04-05 ENCOUNTER — Encounter: Payer: Self-pay | Admitting: Obstetrics & Gynecology

## 2022-04-05 ENCOUNTER — Ambulatory Visit (INDEPENDENT_AMBULATORY_CARE_PROVIDER_SITE_OTHER): Payer: Medicaid Other | Admitting: Obstetrics & Gynecology

## 2022-04-05 ENCOUNTER — Ambulatory Visit (INDEPENDENT_AMBULATORY_CARE_PROVIDER_SITE_OTHER): Payer: Medicaid Other

## 2022-04-05 ENCOUNTER — Other Ambulatory Visit: Payer: Medicaid Other

## 2022-04-05 VITALS — BP 113/79 | HR 100

## 2022-04-05 DIAGNOSIS — Z3A27 27 weeks gestation of pregnancy: Secondary | ICD-10-CM | POA: Diagnosis not present

## 2022-04-05 DIAGNOSIS — O09522 Supervision of elderly multigravida, second trimester: Secondary | ICD-10-CM

## 2022-04-05 DIAGNOSIS — O0992 Supervision of high risk pregnancy, unspecified, second trimester: Secondary | ICD-10-CM

## 2022-04-05 DIAGNOSIS — Z131 Encounter for screening for diabetes mellitus: Secondary | ICD-10-CM | POA: Diagnosis not present

## 2022-04-05 NOTE — Progress Notes (Signed)
   LOW-RISK PREGNANCY VISIT Patient name: Victoria Savage MRN [id number]  Date of birth: December 26, 1980 Chief Complaint:   Routine Prenatal Visit  History of Present Illness:   Victoria Savage is a 42 y.o. G42P4004 female at [redacted]w[redacted]d with an Estimated Date of Delivery: 07/02/22 being seen today for ongoing management of a low-risk pregnancy.     01/01/2022    2:30 PM 12/25/2021   12:42 PM 12/25/2021   12:36 PM  Depression screen PHQ 2/9  Decreased Interest 1 1 1   Down, Depressed, Hopeless 3 3 3   PHQ - 2 Score 4 4 4   Altered sleeping 3 3 3   Tired, decreased energy 0 0 0  Change in appetite 0 0 0  Feeling bad or failure about yourself  0 0 0  Trouble concentrating 0 0 0  Moving slowly or fidgety/restless 0 0 0  Suicidal thoughts 0 0 0  PHQ-9 Score 7 7 7     Today she reports no complaints. Contractions: Not present. Vag. Bleeding: None.  Movement: Present. denies leaking of fluid. Review of Systems:   Pertinent items are noted in HPI Denies abnormal vaginal discharge w/ itching/odor/irritation, headaches, visual changes, shortness of breath, chest pain, abdominal pain, severe nausea/vomiting, or problems with urination or bowel movements unless otherwise stated above. Pertinent History Reviewed:  Reviewed past medical,surgical, social, obstetrical and family history.  Reviewed problem list, medications and allergies. Physical Assessment:   Vitals:   04/05/22 1035  BP: 113/79  Pulse: 100  There is no height or weight on file to calculate BMI.        Physical Examination:   General appearance: Well appearing, and in no distress  Mental status: Alert, oriented to person, place, and time  Skin: Warm & dry  Cardiovascular: Normal heart rate noted  Respiratory: Normal respiratory effort, no distress  Abdomen: Soft, gravid, nontender  Pelvic: Cervical exam deferred         Extremities: Edema: None  Fetal Status:     Movement: Present    Chaperone: n/a    No results found for this or any  previous visit (from the past 24 hour(s)).  Assessment & Plan:  1) Low-risk pregnancy G5P4004 at [redacted]w[redacted]d with an Estimated Date of Delivery: 04/12/52   AMA uncomplicated   Meds: No orders of the defined types were placed in this encounter.  Labs/procedures today: sonogram  Plan:  Continue routine obstetrical care  Next visit: prefers in person    Reviewed: Preterm labor symptoms and general obstetric precautions including but not limited to vaginal bleeding, contractions, leaking of fluid and fetal movement were reviewed in detail with the patient.  All questions were answered. Has home bp cuff. Rx faxed to . Check bp weekly, let us know if >140/90.   Follow-up: Return in about 4 weeks (around 05/03/2022) for [place].  No orders of the defined types were placed in this encounter.   Florian Buff, MD 04/05/2022 10:50 AM

## 2022-04-05 NOTE — Progress Notes (Signed)
[place] 27+3 wks,breech,CX 2.9 cm,posterior placenta gr 0,normal ovaries,AFI 14.6 cm,FHR 143 bpm,EFW 1080 g 39%

## 2022-04-06 LAB — ANTIBODY SCREEN: Antibody Screen: NEGATIVE

## 2022-04-06 LAB — GLUCOSE TOLERANCE, 2 HOURS W/ 1HR
Glucose, 1 hour: 181 mg/dL — ABNORMAL HIGH (ref 70–179)
Glucose, 2 hour: 135 mg/dL (ref 70–152)
Glucose, Fasting: 111 mg/dL — ABNORMAL HIGH (ref 70–91)

## 2022-04-06 LAB — HIV ANTIBODY (ROUTINE TESTING W REFLEX): HIV Screen 4th Generation wRfx: NONREACTIVE

## 2022-04-06 LAB — CBC
Hematocrit: 31.8 % — ABNORMAL LOW (ref 34.0–46.6)
Hemoglobin: 10.2 g/dL — ABNORMAL LOW (ref 11.1–15.9)
MCH: 27.3 pg (ref 26.6–33.0)
MCHC: 32.1 g/dL (ref 31.5–35.7)
MCV: 85 fL (ref 79–97)
Platelets: 286 10*3/uL (ref 150–450)
RBC: 3.74 x10E6/uL — ABNORMAL LOW (ref 3.77–5.28)
RDW: 14.2 % (ref 11.7–15.4)
WBC: 11 10*3/uL — ABNORMAL HIGH (ref 3.4–10.8)

## 2022-04-06 LAB — RPR: RPR Ser Ql: NONREACTIVE

## 2022-04-09 ENCOUNTER — Telehealth: Payer: Self-pay | Admitting: *Deleted

## 2022-04-09 DIAGNOSIS — Z8632 Personal history of gestational diabetes: Secondary | ICD-10-CM | POA: Insufficient documentation

## 2022-04-09 DIAGNOSIS — O24419 Gestational diabetes mellitus in pregnancy, unspecified control: Secondary | ICD-10-CM | POA: Insufficient documentation

## 2022-04-09 DIAGNOSIS — O24410 Gestational diabetes mellitus in pregnancy, diet controlled: Secondary | ICD-10-CM

## 2022-04-09 MED ORDER — ACCU-CHEK GUIDE VI STRP: ORAL_STRIP | 12 refills | Status: DC

## 2022-04-09 MED ORDER — ACCU-CHEK SOFTCLIX LANCETS MISC: 12 refills | Status: DC

## 2022-04-09 MED ORDER — ACCU-CHEK GUIDE ME W/DEVICE KIT: 1.0000 | PACK | Freq: Four times a day (QID) | 0 refills | Status: DC

## 2022-04-09 NOTE — Telephone Encounter (Signed)
Patient made aware she did not pass glucose test. Testing supplies sent to pharmacy. Will need to check blood sugar 4 times daily and bring log to appointments. Pt verbalized understanding with no further questions.

## 2022-04-18 ENCOUNTER — Other Ambulatory Visit: Payer: Medicaid Other

## 2022-04-24 ENCOUNTER — Encounter: Payer: Self-pay | Admitting: Nutrition

## 2022-04-24 ENCOUNTER — Encounter: Payer: Medicaid Other | Attending: Obstetrics & Gynecology | Admitting: Nutrition

## 2022-04-24 VITALS — Ht 62.0 in | Wt 198.0 lb

## 2022-04-24 DIAGNOSIS — O24410 Gestational diabetes mellitus in pregnancy, diet controlled: Secondary | ICD-10-CM | POA: Diagnosis present

## 2022-04-24 NOTE — Progress Notes (Signed)
  Patient was seen on 04-24-22 for Gestational Diabetes self-management class at [facility]. The following learning objectives were met by the patient during this course:  States the definition of Gestational Diabetes States why dietary management is important in controlling blood glucose Describes the effects each nutrient has on blood glucose levels Demonstrates ability to create a balanced meal plan Demonstrates carbohydrate counting  States when to check blood glucose levels Demonstrates proper blood glucose monitoring techniques States the effect of stress and exercise on blood glucose levels States the importance of limiting caffeine and abstaining from alcohol and smoking   Blood glucose reading: 121 mg/dl this am.  Patient instructed to monitor glucose levels: FBS: 60 - <90 1 hour: <140 2 hour: <120  *Patient received handouts: Nutrition Diabetes and Pregnancy Carbohydrate Counting List  Patient will be seen for follow-up in 1 week. Test 4 times per day as instructed.

## 2022-05-01 ENCOUNTER — Encounter: Payer: Medicaid Other | Admitting: Nutrition

## 2022-05-01 ENCOUNTER — Encounter: Payer: Self-pay | Admitting: Advanced Practice Midwife

## 2022-05-01 ENCOUNTER — Other Ambulatory Visit: Payer: Self-pay | Admitting: *Deleted

## 2022-05-01 DIAGNOSIS — O0993 Supervision of high risk pregnancy, unspecified, third trimester: Secondary | ICD-10-CM

## 2022-05-01 DIAGNOSIS — O24410 Gestational diabetes mellitus in pregnancy, diet controlled: Secondary | ICD-10-CM

## 2022-05-01 MED ORDER — BLOOD PRESSURE MONITOR MISC: 0 refills | Status: AC

## 2022-05-02 DIAGNOSIS — O0881 Cardiac arrest following an ectopic and molar pregnancy: Secondary | ICD-10-CM | POA: Diagnosis not present

## 2022-05-03 ENCOUNTER — Ambulatory Visit (INDEPENDENT_AMBULATORY_CARE_PROVIDER_SITE_OTHER): Payer: Medicaid Other | Admitting: Advanced Practice Midwife

## 2022-05-03 VITALS — BP 105/71 | HR 101 | Wt 201.0 lb

## 2022-05-03 DIAGNOSIS — O0993 Supervision of high risk pregnancy, unspecified, third trimester: Secondary | ICD-10-CM

## 2022-05-03 DIAGNOSIS — Z23 Encounter for immunization: Secondary | ICD-10-CM

## 2022-05-03 DIAGNOSIS — Z3A31 31 weeks gestation of pregnancy: Secondary | ICD-10-CM

## 2022-05-03 DIAGNOSIS — O24419 Gestational diabetes mellitus in pregnancy, unspecified control: Secondary | ICD-10-CM

## 2022-05-03 DIAGNOSIS — O24410 Gestational diabetes mellitus in pregnancy, diet controlled: Secondary | ICD-10-CM

## 2022-05-03 NOTE — Patient Instructions (Signed)
Victoria Savage, thank you for choosing our office today! We appreciate the opportunity to meet your healthcare needs. You may receive a short survey by mail, e-mail, or through MyChart. If you are happy with your care we would appreciate if you could take just a few minutes to complete the survey questions. We read all of your comments and take your feedback very seriously. Thank you again for choosing our office.  [facility] ([address] [place], [place] [address]) Entrance C, located off of [address] Free 24/7 valet parking   CLASSES: Go to [email or web address] to register for classes (childbirth, breastfeeding, waterbirth, infant CPR, daddy bootcamp, etc.)  Call the office ([phone]) or go to [facility] if: You begin to have strong, frequent contractions Your water breaks.  Sometimes it is a big gush of fluid, sometimes it is just a trickle that keeps getting your panties wet or running down your legs You have vaginal bleeding.  It is normal to have a small amount of spotting if your cervix was checked.  You don't feel your baby moving like normal.  If you don't, get you something to eat and drink and lay down and focus on feeling your baby move.   If your baby is still not moving like normal, you should call the office or go to [facility].  Call the office ([phone]) or go to [facility] for these signs of pre-eclampsia: Severe headache that does not go away with Tylenol Visual changes- seeing spots, double, blurred vision Pain under your right breast or upper abdomen that does not go away with Tums or heartburn medicine Nausea and/or vomiting Severe swelling in your hands, feet, and face   Tdap Vaccine It is recommended that you get the Tdap vaccine during the third trimester of EACH pregnancy to help protect your baby from getting pertussis (whooping cough) 27-36 weeks is the BEST time to do  this so that you can pass the protection on to your baby. During pregnancy is better than after pregnancy, but if you are unable to get it during pregnancy it will be offered at the hospital.  You can get this vaccine with us, at the health department, your family doctor, or some local pharmacies Everyone who will be around your baby should also be up-to-date on their vaccines before the baby comes. Adults (who are not pregnant) only need 1 dose of Tdap during adulthood.   [place] Pediatricians/Family Doctors [facility] ([facility]): [address], [phone]           [facility]: [address], [phone]                [facility] ([facility]): [address], [phone] (call to ask if accepting patients) [facility]: [address], [place], [phone]    [place] Pediatricians/Family Doctors [facility] ([facility]): [address], [phone] [facility]: [address], [phone] [facility]: [address], [phone]  [place] [facility]  [facility] ([facility]): [phone] [facility]: [address], [phone]   [place] [facility]: [address], [phone]  [place] [facility]  [facility]: [address], [phone]  Home Blood Pressure Monitoring for Patients   Your provider has recommended that you check your   blood pressure (BP) at least once a week at home. If you do not have a blood pressure cuff at home, one will be provided for you. Contact your provider if you have not received your monitor within 1 week.   Helpful Tips for Accurate Home Blood Pressure Checks  Don't smoke, exercise, or drink caffeine 30 minutes before checking your BP Use the restroom before checking your BP (a full bladder can raise your  pressure) Relax in a comfortable upright chair Feet on the ground Left arm resting comfortably on a flat surface at the level of your heart Legs uncrossed Back supported Sit quietly and don't talk Place the cuff on your bare arm Adjust snuggly, so that only two fingertips can fit between your skin and the top of the cuff Check 2 readings separated by at least one minute Keep a log of your BP readings For a visual, please reference this diagram: http://ccnc.care/bpdiagram  Provider Name: [facility]     Phone: [phone]  Zone 1: ALL CLEAR  Continue to monitor your symptoms:  BP reading is less than 140 (top number) or less than 90 (bottom number)  No right upper stomach pain No headaches or seeing spots No feeling nauseated or throwing up No swelling in face and hands  Zone 2: CAUTION Call your doctor's office for any of the following:  BP reading is greater than 140 (top number) or greater than 90 (bottom number)  Stomach pain under your ribs in the middle or right side Headaches or seeing spots Feeling nauseated or throwing up Swelling in face and hands  Zone 3: EMERGENCY  Seek immediate medical care if you have any of the following:  BP reading is greater than160 (top number) or greater than 110 (bottom number) Severe headaches not improving with Tylenol Serious difficulty catching your breath Any worsening symptoms from Zone 2   Third Trimester of Pregnancy The third trimester is from week 29 through week 42, months 7 through 9. The third trimester is a time when the fetus is growing rapidly. At the end of the ninth month, the fetus is about 20 inches in length and weighs 6-10 pounds.  BODY CHANGES Your body goes through many changes during pregnancy. The changes vary from woman to woman.  Your weight will continue to increase. You can expect to gain 25-35 pounds (11-16 kg) by the end of the pregnancy. You may begin to get stretch marks on your hips, abdomen,  and breasts. You may urinate more often because the fetus is moving lower into your pelvis and pressing on your bladder. You may develop or continue to have heartburn as a result of your pregnancy. You may develop constipation because certain hormones are causing the muscles that push waste through your intestines to slow down. You may develop hemorrhoids or swollen, bulging veins (varicose veins). You may have pelvic pain because of the weight gain and pregnancy hormones relaxing your joints between the bones in your pelvis. Backaches may result from overexertion of the muscles supporting your posture. You may have changes in your hair. These can include thickening of your hair, rapid growth, and changes in texture. Some women also have hair loss during or after pregnancy, or hair that feels dry or thin. Your hair will most likely return to normal after your baby is born. Your breasts will continue to grow and be tender. A yellow discharge may leak from your breasts called colostrum. Your belly button may stick out. You may   feel short of breath because of your expanding uterus. You may notice the fetus "dropping," or moving lower in your abdomen. You may have a bloody mucus discharge. This usually occurs a few days to a week before labor begins. Your cervix becomes thin and soft (effaced) near your due date. WHAT TO EXPECT AT YOUR PRENATAL EXAMS  You will have prenatal exams every 2 weeks until week 36. Then, you will have weekly prenatal exams. During a routine prenatal visit: You will be weighed to make sure you and the fetus are growing normally. Your blood pressure is taken. Your abdomen will be measured to track your baby's growth. The fetal heartbeat will be listened to. Any test results from the previous visit will be discussed. You may have a cervical check near your due date to see if you have effaced. At around 36 weeks, your caregiver will check your cervix. At the same time, your  caregiver will also perform a test on the secretions of the vaginal tissue. This test is to determine if a type of bacteria, Group B streptococcus, is present. Your caregiver will explain this further. Your caregiver may ask you: What your birth plan is. How you are feeling. If you are feeling the baby move. If you have had any abnormal symptoms, such as leaking fluid, bleeding, severe headaches, or abdominal cramping. If you have any questions. Other tests or screenings that may be performed during your third trimester include: Blood tests that check for low iron levels (anemia). Fetal testing to check the health, activity level, and growth of the fetus. Testing is done if you have certain medical conditions or if there are problems during the pregnancy. FALSE LABOR You may feel small, irregular contractions that eventually go away. These are called Braxton Hicks contractions, or false labor. Contractions may last for hours, days, or even weeks before true labor sets in. If contractions come at regular intervals, intensify, or become painful, it is best to be seen by your caregiver.  SIGNS OF LABOR  Menstrual-like cramps. Contractions that are 5 minutes apart or less. Contractions that start on the top of the uterus and spread down to the lower abdomen and back. A sense of increased pelvic pressure or back pain. A watery or bloody mucus discharge that comes from the vagina. If you have any of these signs before the 37th week of pregnancy, call your caregiver right away. You need to go to the hospital to get checked immediately. HOME CARE INSTRUCTIONS  Avoid all smoking, herbs, alcohol, and unprescribed drugs. These chemicals affect the formation and growth of the baby. Follow your caregiver's instructions regarding medicine use. There are medicines that are either safe or unsafe to take during pregnancy. Exercise only as directed by your caregiver. Experiencing uterine cramps is a good sign to  stop exercising. Continue to eat regular, healthy meals. Wear a good support bra for breast tenderness. Do not use hot tubs, steam rooms, or saunas. Wear your seat belt at all times when driving. Avoid raw meat, uncooked cheese, cat litter boxes, and soil used by cats. These carry germs that can cause birth defects in the baby. Take your prenatal vitamins. Try taking a stool softener (if your caregiver approves) if you develop constipation. Eat more high-fiber foods, such as fresh vegetables or fruit and whole grains. Drink plenty of fluids to keep your urine clear or pale yellow. Take warm sitz baths to soothe any pain or discomfort caused by hemorrhoids. Use hemorrhoid cream if   your caregiver approves. If you develop varicose veins, wear support hose. Elevate your feet for 15 minutes, 3-4 times a day. Limit salt in your diet. Avoid heavy lifting, wear low heal shoes, and practice good posture. Rest a lot with your legs elevated if you have leg cramps or low back pain. Visit your dentist if you have not gone during your pregnancy. Use a soft toothbrush to brush your teeth and be gentle when you floss. A sexual relationship may be continued unless your caregiver directs you otherwise. Do not travel far distances unless it is absolutely necessary and only with the approval of your caregiver. Take prenatal classes to understand, practice, and ask questions about the labor and delivery. Make a trial run to the hospital. Pack your hospital bag. Prepare the baby's nursery. Continue to go to all your prenatal visits as directed by your caregiver. SEEK MEDICAL CARE IF: You are unsure if you are in labor or if your water has broken. You have dizziness. You have mild pelvic cramps, pelvic pressure, or nagging pain in your abdominal area. You have persistent nausea, vomiting, or diarrhea. You have a bad smelling vaginal discharge. You have pain with urination. SEEK IMMEDIATE MEDICAL CARE IF:  You  have a fever. You are leaking fluid from your vagina. You have spotting or bleeding from your vagina. You have severe abdominal cramping or pain. You have rapid weight loss or gain. You have shortness of breath with chest pain. You notice sudden or extreme swelling of your face, hands, ankles, feet, or legs. You have not felt your baby move in over an hour. You have severe headaches that do not go away with medicine. You have vision changes. Document Released: 02/19/2001 Document Revised: 03/02/2013 Document Reviewed: 04/28/2012 ExitCare Patient Information 2015 [employer]. This information is not intended to replace advice given to you by your health care provider. Make sure you discuss any questions you have with your health care provider.       

## 2022-05-03 NOTE — Progress Notes (Signed)
HIGH-RISK PREGNANCY VISIT Patient name: Victoria Savage MRN [id number]  Date of birth: 1980/03/15 Chief Complaint:   Routine Prenatal Visit  History of Present Illness:   Victoria Savage is a 42 y.o. G68P4004 female at 53w3dwith an Estimated Date of Delivery: 07/02/22 being seen today for ongoing management of a high-risk pregnancy complicated by diabetes mellitus A2DM currently on Metformin '500mg'$  bid (picking it up today) .  Fasting values are almost all between 103-120; PP values are almost all <120.  Today she reports  discomforts of 3rd trimester . Contractions: Not present. Vag. Bleeding: None.  Movement: Present. denies leaking of fluid.      01/01/2022    2:30 PM 12/25/2021   12:42 PM 12/25/2021   12:36 PM  Depression screen PHQ 2/9  Decreased Interest '1 1 1  '$ Down, Depressed, Hopeless '3 3 3  '$ PHQ - 2 Score '4 4 4  '$ Altered sleeping '3 3 3  '$ Tired, decreased energy 0 0 0  Change in appetite 0 0 0  Feeling bad or failure about yourself  0 0 0  Trouble concentrating 0 0 0  Moving slowly or fidgety/restless 0 0 0  Suicidal thoughts 0 0 0  PHQ-9 Score '7 7 7        '$ 01/01/2022    2:31 PM 12/25/2021   12:42 PM 12/25/2021   12:36 PM  GAD 7 : Generalized Anxiety Score  Nervous, Anxious, on Edge 0 0 0  Control/stop worrying 0 0 0  Worry too much - different things 0 0 0  Trouble relaxing 0 0 0  Restless 0 0 0  Easily annoyed or irritable 0 0 0  Afraid - awful might happen 0 0 0  Total GAD 7 Score 0 0 0     Review of Systems:   Pertinent items are noted in HPI Denies abnormal vaginal discharge w/ itching/odor/irritation, headaches, visual changes, shortness of breath, chest pain, abdominal pain, severe nausea/vomiting, or problems with urination or bowel movements unless otherwise stated above. Pertinent History Reviewed:  Reviewed past medical,surgical, social, obstetrical and family history.  Reviewed problem list, medications and allergies. Physical Assessment:   Vitals:    05/03/22 1005  BP: 105/71  Pulse: (!) 101  Weight: 201 lb (91.2 kg)  Body mass index is 36.76 kg/m.           Physical Examination:   General appearance: alert, well appearing, and in no distress  Mental status: alert, oriented to person, place, and time  Skin: warm & dry   Extremities:      Cardiovascular: normal heart rate noted  Respiratory: normal respiratory effort, no distress  Abdomen: gravid, soft, non-tender  Pelvic: Cervical exam deferred         Fetal Status: Fetal Heart Rate (bpm): 143 Fundal Height: 31 cm Movement: Present    Fetal Surveillance Testing today: doppler    No results found for this or any previous visit (from the past 24 hour(s)).  Assessment & Plan:  High-risk pregnancy: G5P4004 at 374w3dith an Estimated Date of Delivery: 07/02/22   1) GDMA2, Metformin '500mg'$  bid has been sent in and she is picking it up today; will start 2x/wk testing next week with growth q 4wk   Meds: No orders of the defined types were placed in this encounter.   Labs/procedures today: tdap  Treatment Plan:   Growth u/s q4wks    2x/wk testing @ 32wks or weekly BPP    Deliver @ 39-39.6wks:______   Reviewed:  Preterm labor symptoms and general obstetric precautions including but not limited to vaginal bleeding, contractions, leaking of fluid and fetal movement were reviewed in detail with the patient.  All questions were answered. Didn't ask about home bp cuff.    Follow-up: Return for Start 2x/wk testing next week (BPP/NST and EFW next wk & in 4wks); HROB w MD.   No future appointments.  Orders Placed This Encounter  Procedures   US OB Follow Up   US Fetal BPP W/O Non Stress   Myrtis Ser [facility] 05/03/2022 10:39 AM

## 2022-05-06 ENCOUNTER — Other Ambulatory Visit: Payer: Self-pay | Admitting: Women's Health

## 2022-05-06 ENCOUNTER — Telehealth: Payer: Self-pay

## 2022-05-06 MED ORDER — METFORMIN HCL 500 MG PO TABS: 500.0000 mg | ORAL_TABLET | Freq: Two times a day (BID) | ORAL | 3 refills | Status: DC

## 2022-05-06 NOTE — Telephone Encounter (Signed)
Patient called and stated that she needs her Metformin called in to [facility]. Patient stated that she has been waiting for the medication since the 23rd.

## 2022-05-07 ENCOUNTER — Other Ambulatory Visit: Payer: Medicaid Other

## 2022-05-10 ENCOUNTER — Encounter: Payer: Self-pay | Admitting: Advanced Practice Midwife

## 2022-05-10 ENCOUNTER — Other Ambulatory Visit: Payer: Medicaid Other

## 2022-05-10 ENCOUNTER — Ambulatory Visit (INDEPENDENT_AMBULATORY_CARE_PROVIDER_SITE_OTHER): Payer: Medicaid Other | Admitting: Advanced Practice Midwife

## 2022-05-10 VITALS — BP 115/70 | HR 77 | Wt 203.0 lb

## 2022-05-10 DIAGNOSIS — Z331 Pregnant state, incidental: Secondary | ICD-10-CM

## 2022-05-10 DIAGNOSIS — Z3A32 32 weeks gestation of pregnancy: Secondary | ICD-10-CM

## 2022-05-10 DIAGNOSIS — O0993 Supervision of high risk pregnancy, unspecified, third trimester: Secondary | ICD-10-CM

## 2022-05-10 DIAGNOSIS — Z1389 Encounter for screening for other disorder: Secondary | ICD-10-CM

## 2022-05-10 LAB — POCT URINALYSIS DIPSTICK OB
Glucose, UA: NEGATIVE
Ketones, UA: NEGATIVE
Nitrite, UA: NEGATIVE
POC,PROTEIN,UA: NEGATIVE

## 2022-05-10 NOTE — Patient Instructions (Signed)
Francely, thank you for choosing our office today! We appreciate the opportunity to meet your healthcare needs. You may receive a short survey by mail, e-mail, or through MyChart. If you are happy with your care we would appreciate if you could take just a few minutes to complete the survey questions. We read all of your comments and take your feedback very seriously. Thank you again for choosing our office.  [facility]  [facility] ([address] [place], [place] [address]) Entrance C, located off of [address] Free 24/7 valet parking   CLASSES: Go to [email or web address] to register for classes (childbirth, breastfeeding, waterbirth, infant CPR, daddy bootcamp, etc.)  Call the office ([phone]) or go to [facility] if: You begin to have strong, frequent contractions Your water breaks.  Sometimes it is a big gush of fluid, sometimes it is just a trickle that keeps getting your panties wet or running down your legs You have vaginal bleeding.  It is normal to have a small amount of spotting if your cervix was checked.  You don't feel your baby moving like normal.  If you don't, get you something to eat and drink and lay down and focus on feeling your baby move.   If your baby is still not moving like normal, you should call the office or go to [facility].  Call the office ([phone]) or go to [facility] for these signs of pre-eclampsia: Severe headache that does not go away with Tylenol Visual changes- seeing spots, double, blurred vision Pain under your right breast or upper abdomen that does not go away with Tums or heartburn medicine Nausea and/or vomiting Severe swelling in your hands, feet, and face   Tdap Vaccine It is recommended that you get the Tdap vaccine during the third trimester of EACH pregnancy to help protect your baby from getting pertussis (whooping cough) 27-36 weeks is the BEST time to do  this so that you can pass the protection on to your baby. During pregnancy is better than after pregnancy, but if you are unable to get it during pregnancy it will be offered at the hospital.  You can get this vaccine with us, at the health department, your family doctor, or some local pharmacies Everyone who will be around your baby should also be up-to-date on their vaccines before the baby comes. Adults (who are not pregnant) only need 1 dose of Tdap during adulthood.   [place] Pediatricians/Family Doctors [facility] ([facility]): [address], [phone]           [facility]: [address], [phone]                [facility] ([facility]): [address], [phone] (call to ask if accepting patients) [facility]: [address], [place], [phone]    [place] Pediatricians/Family Doctors [facility] ([facility]): [address], [phone] [facility]: [address], [phone] [facility]: [address], [phone]  [place] [facility]  [facility] ([facility]): [phone] [facility]: [address], [phone]   [place] Family Doctors [facility]: [address], [phone]  [place] [facility]  [facility]: [address], [phone]  Home Blood Pressure Monitoring for Patients   Your provider has recommended that you check your   blood pressure (BP) at least once a week at home. If you do not have a blood pressure cuff at home, one will be provided for you. Contact your provider if you have not received your monitor within 1 week.   Helpful Tips for Accurate Home Blood Pressure Checks  Don't smoke, exercise, or drink caffeine 30 minutes before checking your BP Use the restroom before checking your BP (a full bladder can raise your  pressure) Relax in a comfortable upright chair Feet on the ground Left arm resting comfortably on a flat surface at the level of your heart Legs uncrossed Back supported Sit quietly and don't talk Place the cuff on your bare arm Adjust snuggly, so that only two fingertips can fit between your skin and the top of the cuff Check 2 readings separated by at least one minute Keep a log of your BP readings For a visual, please reference this diagram: http://ccnc.care/bpdiagram  Provider Name: [facility]     Phone: [phone]  Zone 1: ALL CLEAR  Continue to monitor your symptoms:  BP reading is less than 140 (top number) or less than 90 (bottom number)  No right upper stomach pain No headaches or seeing spots No feeling nauseated or throwing up No swelling in face and hands  Zone 2: CAUTION Call your doctor's office for any of the following:  BP reading is greater than 140 (top number) or greater than 90 (bottom number)  Stomach pain under your ribs in the middle or right side Headaches or seeing spots Feeling nauseated or throwing up Swelling in face and hands  Zone 3: EMERGENCY  Seek immediate medical care if you have any of the following:  BP reading is greater than160 (top number) or greater than 110 (bottom number) Severe headaches not improving with Tylenol Serious difficulty catching your breath Any worsening symptoms from Zone 2   Third Trimester of Pregnancy The third trimester is from week 29 through week 42, months 7 through 9. The third trimester is a time when the fetus is growing rapidly. At the end of the ninth month, the fetus is about 20 inches in length and weighs 6-10 pounds.  BODY CHANGES Your body goes through many changes during pregnancy. The changes vary from woman to woman.  Your weight will continue to increase. You can expect to gain 25-35 pounds (11-16 kg) by the end of the pregnancy. You may begin to get stretch marks on your hips, abdomen,  and breasts. You may urinate more often because the fetus is moving lower into your pelvis and pressing on your bladder. You may develop or continue to have heartburn as a result of your pregnancy. You may develop constipation because certain hormones are causing the muscles that push waste through your intestines to slow down. You may develop hemorrhoids or swollen, bulging veins (varicose veins). You may have pelvic pain because of the weight gain and pregnancy hormones relaxing your joints between the bones in your pelvis. Backaches may result from overexertion of the muscles supporting your posture. You may have changes in your hair. These can include thickening of your hair, rapid growth, and changes in texture. Some women also have hair loss during or after pregnancy, or hair that feels dry or thin. Your hair will most likely return to normal after your baby is born. Your breasts will continue to grow and be tender. A yellow discharge may leak from your breasts called colostrum. Your belly button may stick out. You may   feel short of breath because of your expanding uterus. You may notice the fetus "dropping," or moving lower in your abdomen. You may have a bloody mucus discharge. This usually occurs a few days to a week before labor begins. Your cervix becomes thin and soft (effaced) near your due date. WHAT TO EXPECT AT YOUR PRENATAL EXAMS  You will have prenatal exams every 2 weeks until week 36. Then, you will have weekly prenatal exams. During a routine prenatal visit: You will be weighed to make sure you and the fetus are growing normally. Your blood pressure is taken. Your abdomen will be measured to track your baby's growth. The fetal heartbeat will be listened to. Any test results from the previous visit will be discussed. You may have a cervical check near your due date to see if you have effaced. At around 36 weeks, your caregiver will check your cervix. At the same time, your  caregiver will also perform a test on the secretions of the vaginal tissue. This test is to determine if a type of bacteria, Group B streptococcus, is present. Your caregiver will explain this further. Your caregiver may ask you: What your birth plan is. How you are feeling. If you are feeling the baby move. If you have had any abnormal symptoms, such as leaking fluid, bleeding, severe headaches, or abdominal cramping. If you have any questions. Other tests or screenings that may be performed during your third trimester include: Blood tests that check for low iron levels (anemia). Fetal testing to check the health, activity level, and growth of the fetus. Testing is done if you have certain medical conditions or if there are problems during the pregnancy. FALSE LABOR You may feel small, irregular contractions that eventually go away. These are called Braxton Hicks contractions, or false labor. Contractions may last for hours, days, or even weeks before true labor sets in. If contractions come at regular intervals, intensify, or become painful, it is best to be seen by your caregiver.  SIGNS OF LABOR  Menstrual-like cramps. Contractions that are 5 minutes apart or less. Contractions that start on the top of the uterus and spread down to the lower abdomen and back. A sense of increased pelvic pressure or back pain. A watery or bloody mucus discharge that comes from the vagina. If you have any of these signs before the 37th week of pregnancy, call your caregiver right away. You need to go to the hospital to get checked immediately. HOME CARE INSTRUCTIONS  Avoid all smoking, herbs, alcohol, and unprescribed drugs. These chemicals affect the formation and growth of the baby. Follow your caregiver's instructions regarding medicine use. There are medicines that are either safe or unsafe to take during pregnancy. Exercise only as directed by your caregiver. Experiencing uterine cramps is a good sign to  stop exercising. Continue to eat regular, healthy meals. Wear a good support bra for breast tenderness. Do not use hot tubs, steam rooms, or saunas. Wear your seat belt at all times when driving. Avoid raw meat, uncooked cheese, cat litter boxes, and soil used by cats. These carry germs that can cause birth defects in the baby. Take your prenatal vitamins. Try taking a stool softener (if your caregiver approves) if you develop constipation. Eat more high-fiber foods, such as fresh vegetables or fruit and whole grains. Drink plenty of fluids to keep your urine clear or pale yellow. Take warm sitz baths to soothe any pain or discomfort caused by hemorrhoids. Use hemorrhoid cream if   your caregiver approves. If you develop varicose veins, wear support hose. Elevate your feet for 15 minutes, 3-4 times a day. Limit salt in your diet. Avoid heavy lifting, wear low heal shoes, and practice good posture. Rest a lot with your legs elevated if you have leg cramps or low back pain. Visit your dentist if you have not gone during your pregnancy. Use a soft toothbrush to brush your teeth and be gentle when you floss. A sexual relationship may be continued unless your caregiver directs you otherwise. Do not travel far distances unless it is absolutely necessary and only with the approval of your caregiver. Take prenatal classes to understand, practice, and ask questions about the labor and delivery. Make a trial run to the hospital. Pack your hospital bag. Prepare the baby's nursery. Continue to go to all your prenatal visits as directed by your caregiver. SEEK MEDICAL CARE IF: You are unsure if you are in labor or if your water has broken. You have dizziness. You have mild pelvic cramps, pelvic pressure, or nagging pain in your abdominal area. You have persistent nausea, vomiting, or diarrhea. You have a bad smelling vaginal discharge. You have pain with urination. SEEK IMMEDIATE MEDICAL CARE IF:  You  have a fever. You are leaking fluid from your vagina. You have spotting or bleeding from your vagina. You have severe abdominal cramping or pain. You have rapid weight loss or gain. You have shortness of breath with chest pain. You notice sudden or extreme swelling of your face, hands, ankles, feet, or legs. You have not felt your baby move in over an hour. You have severe headaches that do not go away with medicine. You have vision changes. Document Released: 02/19/2001 Document Revised: 03/02/2013 Document Reviewed: 04/28/2012 ExitCare Patient Information 2015 [employer]. This information is not intended to replace advice given to you by your health care provider. Make sure you discuss any questions you have with your health care provider.       

## 2022-05-10 NOTE — Progress Notes (Signed)
HIGH-RISK PREGNANCY VISIT Patient name: Victoria Savage MRN [id number]  Date of birth: 1980/03/19 Chief Complaint:   High Risk Gestation, Routine Prenatal Visit, and Non-stress Test  History of Present Illness:   Victoria Savage is a 42 y.o. G55P4004 female at 90w3dwith an Estimated Date of Delivery: 07/02/22 being seen today for ongoing management of a high-risk pregnancy complicated by diabetes mellitus A2DM currently on Metformin '500mg'$  bid (just started this morning) .  Log shows pretty nl PP values but fastings still in the low 100s.  Today she reports  intermittent pressure . Contractions: Irregular.  .  Movement: Present. denies leaking of fluid.      01/01/2022    2:30 PM 12/25/2021   12:42 PM 12/25/2021   12:36 PM  Depression screen PHQ 2/9  Decreased Interest '1 1 1  '$ Down, Depressed, Hopeless '3 3 3  '$ PHQ - 2 Score '4 4 4  '$ Altered sleeping '3 3 3  '$ Tired, decreased energy 0 0 0  Change in appetite 0 0 0  Feeling bad or failure about yourself  0 0 0  Trouble concentrating 0 0 0  Moving slowly or fidgety/restless 0 0 0  Suicidal thoughts 0 0 0  PHQ-9 Score '7 7 7        '$ 01/01/2022    2:31 PM 12/25/2021   12:42 PM 12/25/2021   12:36 PM  GAD 7 : Generalized Anxiety Score  Nervous, Anxious, on Edge 0 0 0  Control/stop worrying 0 0 0  Worry too much - different things 0 0 0  Trouble relaxing 0 0 0  Restless 0 0 0  Easily annoyed or irritable 0 0 0  Afraid - awful might happen 0 0 0  Total GAD 7 Score 0 0 0     Review of Systems:   Pertinent items are noted in HPI Denies abnormal vaginal discharge w/ itching/odor/irritation, headaches, visual changes, shortness of breath, chest pain, abdominal pain, severe nausea/vomiting, or problems with urination or bowel movements unless otherwise stated above. Pertinent History Reviewed:  Reviewed past medical,surgical, social, obstetrical and family history.  Reviewed problem list, medications and allergies. Physical Assessment:    Vitals:   05/10/22 1006  BP: 115/70  Pulse: 77  Weight: 203 lb (92.1 kg)  Body mass index is 37.13 kg/m.           Physical Examination:   General appearance: alert, well appearing, and in no distress  Mental status: alert, oriented to person, place, and time  Skin: warm & dry   Extremities: Edema: Trace    Cardiovascular: normal heart rate noted  Respiratory: normal respiratory effort, no distress  Abdomen: gravid, soft, non-tender  Pelvic: Cervical exam deferred         Fetal Status: Fetal Heart Rate (bpm): 145 NST   Movement: Present    Fetal Surveillance Testing today: NST: baseline 145, +accels, no decels, ctx q 2-5 mins    Results for orders placed or performed in visit on 05/10/22 (from the past 24 hour(s))  POC Urinalysis Dipstick OB   Collection Time: 05/10/22 10:31 AM  Result Value Ref Range   Color, UA     Clarity, UA     Glucose, UA Negative Negative   Bilirubin, UA     Ketones, UA negative    Spec Grav, UA     Blood, UA trace    pH, UA     POC,PROTEIN,UA Negative Negative, Trace, Small (1+), Moderate (2+), Large (3+), 4+  Urobilinogen, UA     Nitrite, UA negative    Leukocytes, UA Small (1+) (A) Negative   Appearance     Odor      Assessment & Plan:  High-risk pregnancy: [id number] at 30w3dwith an Estimated Date of Delivery: 07/02/22   1) GDMA2, unstable; just started Metformin today ('500mg'$  bid); encouraged to take it regularly to get the fasting numbers down  2) AMA age 42 already in ante testing due to GDM  Meds: No orders of the defined types were placed in this encounter.   Labs/procedures today:  NST  Treatment Plan:  continue 2x/wk testing with growth q 4wk  Reviewed: Preterm labor symptoms and general obstetric precautions including but not limited to vaginal bleeding, contractions, leaking of fluid and fetal movement were reviewed in detail with the patient.  All questions were answered. Does not have home bp cuff. Office bp cuff given:  not applicable (will resend order) Check bp weekly, let uKoreaknow if consistently >140 and/or >90.  Follow-up: Return for As scheduled.   Future Appointments  Date Time Provider DPrimghar 05/14/2022 11:30 AM [facility] [place] None  05/14/2022  1:30 PM EChancy Milroy MD [facility]  05/17/2022 10:30 AM [facility] NURSE [facility]  05/21/2022 10:30 AM [facility] NURSE [facility]  05/21/2022 10:50 AM EFlorian Buff MD [facility]  05/24/2022 10:50 AM [facility] NURSE [facility]  05/28/2022 10:50 AM [facility] NURSE [facility]  05/28/2022 11:10 AM EFlorian Buff MD [facility]  05/31/2022 10:10 AM [facility] NURSE [facility]  06/04/2022 10:10 AM [facility] NURSE [facility]  06/04/2022 10:30 AM OJanyth Pupa DO [facility]  06/11/2022 10:00 AM [facility] None  06/11/2022 10:50 AM EFlorian Buff MD [facility]  06/14/2022 10:30 AM [facility] NURSE [facility]  06/18/2022  9:50 AM [facility] NURSE [facility]  06/18/2022 10:10 AM EFlorian Buff MD [facility]  06/21/2022  9:30 AM [facility] NURSE [facility]  06/25/2022 10:50 AM [facility] NURSE [facility]  06/25/2022 11:10 AM OJanyth Pupa DO [facility]  06/28/2022 10:30 AM [facility] NURSE [facility]    Orders Placed This Encounter  Procedures   POC Urinalysis Dipstick OB   KMyrtis SerCNM 05/10/2022 11:01 AM

## 2022-05-14 ENCOUNTER — Ambulatory Visit (INDEPENDENT_AMBULATORY_CARE_PROVIDER_SITE_OTHER): Payer: Medicaid Other | Admitting: Obstetrics and Gynecology

## 2022-05-14 ENCOUNTER — Encounter: Payer: Self-pay | Admitting: Obstetrics and Gynecology

## 2022-05-14 ENCOUNTER — Ambulatory Visit (INDEPENDENT_AMBULATORY_CARE_PROVIDER_SITE_OTHER): Payer: Medicaid Other

## 2022-05-14 VITALS — BP 125/79 | HR 97 | Wt 202.0 lb

## 2022-05-14 DIAGNOSIS — O24410 Gestational diabetes mellitus in pregnancy, diet controlled: Secondary | ICD-10-CM

## 2022-05-14 DIAGNOSIS — O0993 Supervision of high risk pregnancy, unspecified, third trimester: Secondary | ICD-10-CM

## 2022-05-14 DIAGNOSIS — Z3A33 33 weeks gestation of pregnancy: Secondary | ICD-10-CM

## 2022-05-14 DIAGNOSIS — O24419 Gestational diabetes mellitus in pregnancy, unspecified control: Secondary | ICD-10-CM

## 2022-05-14 DIAGNOSIS — Z3A34 34 weeks gestation of pregnancy: Secondary | ICD-10-CM | POA: Diagnosis not present

## 2022-05-14 DIAGNOSIS — O09523 Supervision of elderly multigravida, third trimester: Secondary | ICD-10-CM

## 2022-05-14 NOTE — Progress Notes (Signed)
Subjective:  Victoria Savage is a 42 y.o. G5P4004 at 73w0dbeing seen today for ongoing prenatal care.  She is currently monitored for the following issues for this high-risk pregnancy and has Supervision of high-risk pregnancy; AMA (advanced maternal age) multigravida 35+; and GDM, class A2 on their problem list.  Patient reports  general discomforts of pregnancy .  Contractions: Not present. Vag. Bleeding: None.  Movement: Present. Denies leaking of fluid.   The following portions of the patient's history were reviewed and updated as appropriate: allergies, current medications, past family history, past medical history, past social history, past surgical history and problem list. Problem list updated.  Objective:   Vitals:   05/14/22 1220  BP: 125/79  Pulse: 97  Weight: 202 lb (91.6 kg)    Fetal Status: Fetal Heart Rate (bpm): U/S   Movement: Present     General:  Alert, oriented and cooperative. Patient is in no acute distress.  Skin: Skin is warm and dry. No rash noted.   Cardiovascular: Normal heart rate noted  Respiratory: Normal respiratory effort, no problems with respiration noted  Abdomen: Soft, gravid, appropriate for gestational age. Pain/Pressure: Absent     Pelvic:  Cervical exam deferred        Extremities: Normal range of motion.     Mental Status: Normal mood and affect. Normal behavior. Normal judgment and thought content.   Urinalysis:      Assessment and Plan:  Pregnancy: G5P4004 at 355w0d1. Supervision of high risk pregnancy in third trimester Stable  2. GDM, class A2 CBG's improved with Metformin but just started taking yesterday BPP 8/8  Growth 47 % Continue with Metformin, serial growth scans and weekly antenatal testing  3. Multigravida of advanced maternal age in third trimester See above  Preterm labor symptoms and general obstetric precautions including but not limited to vaginal bleeding, contractions, leaking of fluid and fetal movement were  reviewed in detail with the patient. Please refer to After Visit Summary for other counseling recommendations.  No follow-ups on file.   ErChancy MilroyMD

## 2022-05-14 NOTE — Progress Notes (Signed)
[place] 33 wks,cephalic,BPP [id number] bpm,posterior placenta gr 1,AFI 16 cm,EFW 2141 g 47%

## 2022-05-17 ENCOUNTER — Ambulatory Visit (INDEPENDENT_AMBULATORY_CARE_PROVIDER_SITE_OTHER): Payer: Medicaid Other | Admitting: *Deleted

## 2022-05-17 VITALS — BP 125/76 | HR 102 | Wt 201.0 lb

## 2022-05-17 DIAGNOSIS — O24419 Gestational diabetes mellitus in pregnancy, unspecified control: Secondary | ICD-10-CM

## 2022-05-17 DIAGNOSIS — Z3A33 33 weeks gestation of pregnancy: Secondary | ICD-10-CM

## 2022-05-17 DIAGNOSIS — Z331 Pregnant state, incidental: Secondary | ICD-10-CM

## 2022-05-17 DIAGNOSIS — Z1389 Encounter for screening for other disorder: Secondary | ICD-10-CM

## 2022-05-17 LAB — POCT URINALYSIS DIPSTICK OB
Blood, UA: NEGATIVE
Glucose, UA: NEGATIVE
Ketones, UA: 40
Nitrite, UA: NEGATIVE
POC,PROTEIN,UA: NEGATIVE

## 2022-05-17 NOTE — Addendum Note (Signed)
Addended by: Diona Fanti A on: 05/17/2022 12:52 PM   Modules accepted: Orders

## 2022-05-17 NOTE — Progress Notes (Signed)
   NURSE VISIT- NST  SUBJECTIVE:  Victoria Savage is a 42 y.o. G36P4004 female at [redacted]w[redacted]d, here for a NST for pregnancy complicated by Diabetes: A2DM}.  She reports active fetal movement, contractions: none, vaginal bleeding: none, membranes: intact.   OBJECTIVE:  BP 125/76   Pulse (!) 102   Wt 201 lb (91.2 kg)   LMP  (LMP Unknown)   BMI 36.76 kg/m   Appears well, no apparent distress  No results found for this or any previous visit (from the past 24 hour(s)).  NST: FHR baseline 135 bpm, Variability: moderate, Accelerations:present, Decelerations:  Absent= Cat 1/reactive Toco: none   ASSESSMENT: G5P4004 at [redacted]w[redacted]d with Diabetes: A2DM} NST reactive  PLAN: EFM strip reviewed by Dr. Rip Harbour   Recommendations: keep next appointment as scheduled    Janece Canterbury  05/17/2022 11:44 AM

## 2022-05-21 ENCOUNTER — Ambulatory Visit (INDEPENDENT_AMBULATORY_CARE_PROVIDER_SITE_OTHER): Payer: Medicaid Other | Admitting: Obstetrics & Gynecology

## 2022-05-21 ENCOUNTER — Encounter: Payer: Self-pay | Admitting: Obstetrics & Gynecology

## 2022-05-21 ENCOUNTER — Other Ambulatory Visit: Payer: Medicaid Other

## 2022-05-21 VITALS — BP 114/74 | HR 126 | Wt 203.0 lb

## 2022-05-21 DIAGNOSIS — O24419 Gestational diabetes mellitus in pregnancy, unspecified control: Secondary | ICD-10-CM

## 2022-05-21 DIAGNOSIS — Z1389 Encounter for screening for other disorder: Secondary | ICD-10-CM

## 2022-05-21 DIAGNOSIS — Z331 Pregnant state, incidental: Secondary | ICD-10-CM

## 2022-05-21 DIAGNOSIS — O0993 Supervision of high risk pregnancy, unspecified, third trimester: Secondary | ICD-10-CM | POA: Diagnosis not present

## 2022-05-21 DIAGNOSIS — Z3A34 34 weeks gestation of pregnancy: Secondary | ICD-10-CM | POA: Diagnosis not present

## 2022-05-21 LAB — POCT URINALYSIS DIPSTICK OB
Blood, UA: NEGATIVE
Glucose, UA: NEGATIVE
Ketones, UA: 15
Nitrite, UA: NEGATIVE
POC,PROTEIN,UA: NEGATIVE

## 2022-05-21 MED ORDER — METFORMIN HCL 500 MG PO TABS: ORAL_TABLET | ORAL | 3 refills | Status: DC

## 2022-05-21 NOTE — Progress Notes (Signed)
HIGH-RISK PREGNANCY VISIT Patient name: Victoria Savage MRN [id number]  Date of birth: 1980-06-04 Chief Complaint:   Routine Prenatal Visit, High Risk Gestation, and Non-stress Test  History of Present Illness:   Victoria Savage is a 42 y.o. G40P4004 female at 20w0dwith an Estimated Date of Delivery: 07/02/22 being seen today for ongoing management of a high-risk pregnancy complicated by diabetes mellitus A2DM currently on MTF 500 + '1000mg'$  .    Today she reports no complaints. Contractions: Not present.  .  Movement: Present. denies leaking of fluid.      01/01/2022    2:30 PM 12/25/2021   12:42 PM 12/25/2021   12:36 PM  Depression screen PHQ 2/9  Decreased Interest '1 1 1  '$ Down, Depressed, Hopeless '3 3 3  '$ PHQ - 2 Score '4 4 4  '$ Altered sleeping '3 3 3  '$ Tired, decreased energy 0 0 0  Change in appetite 0 0 0  Feeling bad or failure about yourself  0 0 0  Trouble concentrating 0 0 0  Moving slowly or fidgety/restless 0 0 0  Suicidal thoughts 0 0 0  PHQ-9 Score '7 7 7        '$ 01/01/2022    2:31 PM 12/25/2021   12:42 PM 12/25/2021   12:36 PM  GAD 7 : Generalized Anxiety Score  Nervous, Anxious, on Edge 0 0 0  Control/stop worrying 0 0 0  Worry too much - different things 0 0 0  Trouble relaxing 0 0 0  Restless 0 0 0  Easily annoyed or irritable 0 0 0  Afraid - awful might happen 0 0 0  Total GAD 7 Score 0 0 0     Review of Systems:   Pertinent items are noted in HPI Denies abnormal vaginal discharge w/ itching/odor/irritation, headaches, visual changes, shortness of breath, chest pain, abdominal pain, severe nausea/vomiting, or problems with urination or bowel movements unless otherwise stated above. Pertinent History Reviewed:  Reviewed past medical,surgical, social, obstetrical and family history.  Reviewed problem list, medications and allergies. Physical Assessment:   Vitals:   05/21/22 0932  BP: 114/74  Pulse: (!) 126  Weight: 203 lb (92.1 kg)  Body mass index is  37.13 kg/m.           Physical Examination:   General appearance: alert, well appearing, and in no distress  Mental status: alert, oriented to person, place, and time  Skin: warm & dry   Extremities: Edema: Trace    Cardiovascular: normal heart rate noted  Respiratory: normal respiratory effort, no distress  Abdomen: gravid, soft, non-tender  Pelvic: Cervical exam deferred         Fetal Status:     Movement: Present    Fetal Surveillance Testing today: Reactive NST  JHenly Kuechenmeisteris at 365w0dstimated Date of Delivery: 07/02/22  NST being performed due to A2DM  Today the NST is Reactive  Fetal Monitoring:  Baseline: 140 bpm, Variability: Good {> 6 bpm), Accelerations: Reactive, and Decelerations: Absent   reactive  The accelerations are >15 bpm and more than 2 in 20 minutes  Final diagnosis:  Reactive NST  LuFlorian BuffMD      Chaperone: N/A    Results for orders placed or performed in visit on 05/21/22 (from the past 24 hour(s))  POC Urinalysis Dipstick OB   Collection Time: 05/21/22  9:54 AM  Result Value Ref Range   Color, UA     Clarity, UA  Glucose, UA Negative Negative   Bilirubin, UA     Ketones, UA 15    Spec Grav, UA     Blood, UA negative    pH, UA     POC,PROTEIN,UA Negative Negative, Trace, Small (1+), Moderate (2+), Large (3+), 4+   Urobilinogen, UA     Nitrite, UA negative    Leukocytes, UA Large (3+) (A) Negative   Appearance     Odor      Assessment & Plan:  High-risk pregnancy: [id number] at 56w0dwith an Estimated Date of Delivery: 07/02/22      ICD-10-CM   1. Supervision of high risk pregnancy in third trimester  O09.93     2. GDM, class A2  O24.419    Increase metformin 500 am 1000 qhs    3. Pregnant state, incidental  Z33.1 POC Urinalysis Dipstick OB    4. Screening for genitourinary condition  Z13.89 POC Urinalysis Dipstick OB       Meds:  Meds ordered this encounter  Medications   metFORMIN (GLUCOPHAGE) 500 MG tablet     Sig: 1 tablet(500 mg) in morning and 2 tablets(1000 mg) at bedtime    Dispense:  90 tablet    Refill:  3    Orders:  Orders Placed This Encounter  Procedures   POC Urinalysis Dipstick OB     Labs/procedures today: NST  Treatment Plan:  as scheduled    Follow-up: Return for keep scheduled.   Future Appointments  Date Time Provider [place] 05/24/2022 10:50 AM [facility] NURSE [facility]  05/28/2022 10:50 AM [facility] NURSE [facility]  05/28/2022 11:10 AM EFlorian Buff MD [facility]  05/31/2022 10:10 AM [facility] NURSE [facility]  06/04/2022 10:10 AM [facility] NURSE [facility]  06/04/2022 10:30 AM OJanyth Pupa DO [facility]  06/11/2022 10:00 AM [facility] [place] None  06/11/2022 10:50 AM EFlorian Buff MD [facility]  06/14/2022 10:30 AM [facility] NURSE [facility]  06/18/2022  9:50 AM [facility] NURSE [facility]  06/18/2022 10:10 AM EFlorian Buff MD [facility]  06/21/2022  9:30 AM [facility] NURSE [facility]  06/25/2022 10:50 AM [facility] NURSE [facility]  06/25/2022 11:10 AM OJanyth Pupa DO [facility]  06/28/2022 10:30 AM [facility] NURSE [facility]    Orders Placed This Encounter  Procedures   POC Urinalysis Dipstick OB   LFlorian Buff Attending Physician for the [facility] 05/21/2022 11:21 AM

## 2022-05-24 ENCOUNTER — Ambulatory Visit (INDEPENDENT_AMBULATORY_CARE_PROVIDER_SITE_OTHER): Payer: Medicaid Other | Admitting: *Deleted

## 2022-05-24 VITALS — BP 106/67 | HR 104

## 2022-05-24 DIAGNOSIS — Z3A34 34 weeks gestation of pregnancy: Secondary | ICD-10-CM | POA: Diagnosis not present

## 2022-05-24 DIAGNOSIS — O24419 Gestational diabetes mellitus in pregnancy, unspecified control: Secondary | ICD-10-CM

## 2022-05-24 DIAGNOSIS — O09523 Supervision of elderly multigravida, third trimester: Secondary | ICD-10-CM

## 2022-05-24 DIAGNOSIS — O0993 Supervision of high risk pregnancy, unspecified, third trimester: Secondary | ICD-10-CM

## 2022-05-24 NOTE — Progress Notes (Signed)
   NURSE VISIT- NST  SUBJECTIVE:  Shree Hermance is a 42 y.o. G58P4004 female at [redacted]w[redacted]d, here for a NST for pregnancy complicated by Diabetes: A2DM}. On Metformin.  She reports active fetal movement, contractions: irregular, vaginal bleeding: none, membranes: intact.   OBJECTIVE:  LMP  (LMP Unknown)   Appears well, no apparent distress  No results found for this or any previous visit (from the past 24 hour(s)).  NST: FHR baseline 140 bpm, Variability: moderate, Accelerations:present, Decelerations:  Absent= Cat 1/reactive Toco: UI   ASSESSMENT: [id number] at [redacted]w[redacted]d with Diabetes: A2DM} on Metformin NST reactive  PLAN: EFM strip reviewed by Derrill Memo, CNM   Recommendations: keep next appointment as scheduled    Alice Rieger  05/24/2022 11:05 AM

## 2022-05-28 ENCOUNTER — Ambulatory Visit (INDEPENDENT_AMBULATORY_CARE_PROVIDER_SITE_OTHER): Payer: Medicaid Other | Admitting: Obstetrics & Gynecology

## 2022-05-28 ENCOUNTER — Encounter: Payer: Self-pay | Admitting: Obstetrics & Gynecology

## 2022-05-28 ENCOUNTER — Other Ambulatory Visit: Payer: Medicaid Other

## 2022-05-28 VITALS — BP 113/72 | HR 99 | Wt 203.0 lb

## 2022-05-28 DIAGNOSIS — O0993 Supervision of high risk pregnancy, unspecified, third trimester: Secondary | ICD-10-CM

## 2022-05-28 DIAGNOSIS — O24419 Gestational diabetes mellitus in pregnancy, unspecified control: Secondary | ICD-10-CM

## 2022-05-28 DIAGNOSIS — Z3A35 35 weeks gestation of pregnancy: Secondary | ICD-10-CM

## 2022-05-28 DIAGNOSIS — O09523 Supervision of elderly multigravida, third trimester: Secondary | ICD-10-CM

## 2022-05-28 NOTE — Progress Notes (Signed)
HIGH-RISK PREGNANCY VISIT Patient name: Victoria Savage MRN [id number]  Date of birth: 13-Jun-1980 Chief Complaint:   Routine Prenatal Visit and Non-stress Test  History of Present Illness:   Victoria Savage is a 42 y.o. [id number] female at [redacted]w[redacted]d with an Estimated Date of Delivery: 07/02/22 being seen today for ongoing management of a high-risk pregnancy complicated by [id number] on metformin 500 am, 1000 mg qhs with just a smidge high fastings, 2 hours are all ok, AMA.    Today she reports no complaints. Contractions: Not present. Vag. Bleeding: None.  Movement: Present. denies leaking of fluid.      01/01/2022    2:30 PM 12/25/2021   12:42 PM 12/25/2021   12:36 PM  Depression screen PHQ 2/9  Decreased Interest 1 1 1   Down, Depressed, Hopeless 3 3 3   PHQ - 2 Score 4 4 4   Altered sleeping 3 3 3   Tired, decreased energy 0 0 0  Change in appetite 0 0 0  Feeling bad or failure about yourself  0 0 0  Trouble concentrating 0 0 0  Moving slowly or fidgety/restless 0 0 0  Suicidal thoughts 0 0 0  PHQ-9 Score 7 7 7         01/01/2022    2:31 PM 12/25/2021   12:42 PM 12/25/2021   12:36 PM  GAD 7 : Generalized Anxiety Score  Nervous, Anxious, on Edge 0 0 0  Control/stop worrying 0 0 0  Worry too much - different things 0 0 0  Trouble relaxing 0 0 0  Restless 0 0 0  Easily annoyed or irritable 0 0 0  Afraid - awful might happen 0 0 0  Total GAD 7 Score 0 0 0     Review of Systems:   Pertinent items are noted in HPI Denies abnormal vaginal discharge w/ itching/odor/irritation, headaches, visual changes, shortness of breath, chest pain, abdominal pain, severe nausea/vomiting, or problems with urination or bowel movements unless otherwise stated above. Pertinent History Reviewed:  Reviewed past medical,surgical, social, obstetrical and family history.  Reviewed problem list, medications and allergies. Physical Assessment:   Vitals:   05/28/22 1040  BP: 113/72  Pulse: 99  Weight: 203  lb (92.1 kg)  Body mass index is 37.13 kg/m.           Physical Examination:   General appearance: alert, well appearing, and in no distress  Mental status: alert, oriented to person, place, and time  Skin: warm & dry   Extremities: Edema: None    Cardiovascular: normal heart rate noted  Respiratory: normal respiratory effort, no distress  Abdomen: gravid, soft, non-tender  Pelvic: Cervical exam deferred         Fetal Status:     Movement: Present    Fetal Surveillance Testing today: Reactive NST  Victoria Savage is at [redacted]w[redacted]d Estimated Date of Delivery: 07/02/22  NST being performed due to A2DM  Today the NST is Reactive  Fetal Monitoring:  Baseline: 140 bpm, Variability: Good {> 6 bpm), Accelerations: Reactive, and Decelerations: Absent   reactive  The accelerations are >15 bpm and more than 2 in 20 minutes  Final diagnosis:  Reactive NST  Florian Buff, MD     Chaperone: N/A    No results found for this or any previous visit (from the past 24 hour(s)).  Assessment & Plan:  High-risk pregnancy: [id number] at [redacted]w[redacted]d with an Estimated Date of Delivery: 07/02/22      ICD-10-CM   1. Supervision  of high risk pregnancy in third trimester  O09.93     2. GDM, class A2  O24.419     3. Multigravida of advanced maternal age in third trimester  O09.523         Meds: No orders of the defined types were placed in this encounter.   Orders: No orders of the defined types were placed in this encounter.    Labs/procedures today: NST  Treatment Plan:  twice weekly NST    Follow-up: Return for keep scheduled.   Future Appointments  Date Time Provider [place]  05/31/2022 10:10 AM [facility] NURSE [facility]  06/04/2022 10:10 AM [facility] NURSE [facility]  06/04/2022 10:30 AM Janyth Pupa, DO [facility]  06/11/2022 10:00 AM [facility] [place] [facility] None  06/11/2022 10:50 AM Florian Buff, MD [facility]  06/14/2022 10:30 AM [facility] NURSE [facility]  FTOBGYN  06/18/2022  9:50 AM [facility] NURSE [facility]  06/18/2022 10:10 AM Florian Buff, MD [facility]  06/21/2022  9:30 AM [facility] NURSE [facility]  06/25/2022 10:50 AM [facility] NURSE [facility]  06/25/2022 11:10 AM Janyth Pupa, DO [facility]  06/28/2022 10:30 AM [facility] NURSE [facility]    No orders of the defined types were placed in this encounter.  Florian Buff  Attending Physician for the [facility] 05/28/2022 11:32 AM

## 2022-05-31 ENCOUNTER — Ambulatory Visit (INDEPENDENT_AMBULATORY_CARE_PROVIDER_SITE_OTHER): Payer: Medicaid Other | Admitting: *Deleted

## 2022-05-31 VITALS — BP 108/73 | HR 99 | Wt 201.0 lb

## 2022-05-31 DIAGNOSIS — Z3A35 35 weeks gestation of pregnancy: Secondary | ICD-10-CM | POA: Diagnosis not present

## 2022-05-31 DIAGNOSIS — O24419 Gestational diabetes mellitus in pregnancy, unspecified control: Secondary | ICD-10-CM

## 2022-05-31 DIAGNOSIS — Z1389 Encounter for screening for other disorder: Secondary | ICD-10-CM

## 2022-05-31 DIAGNOSIS — O288 Other abnormal findings on antenatal screening of mother: Secondary | ICD-10-CM

## 2022-05-31 DIAGNOSIS — Z331 Pregnant state, incidental: Secondary | ICD-10-CM

## 2022-05-31 DIAGNOSIS — O0993 Supervision of high risk pregnancy, unspecified, third trimester: Secondary | ICD-10-CM

## 2022-05-31 LAB — POCT URINALYSIS DIPSTICK OB
Blood, UA: NEGATIVE
Glucose, UA: NEGATIVE
Ketones, UA: NEGATIVE
Nitrite, UA: NEGATIVE
POC,PROTEIN,UA: NEGATIVE

## 2022-05-31 NOTE — Progress Notes (Signed)
   NURSE VISIT- NST  SUBJECTIVE:  Victoria Savage is a 42 y.o. G41P4004 female at [redacted]w[redacted]d, here for a NST for pregnancy complicated by Diabetes: A2DM} on Metformin.  She reports active fetal movement, contractions: none, vaginal bleeding: none, membranes: intact.   OBJECTIVE:  BP 108/73   Pulse 99   Wt 201 lb (91.2 kg)   LMP  (LMP Unknown)   BMI 36.76 kg/m   Appears well, no apparent distress  Results for orders placed or performed in visit on 05/31/22 (from the past 24 hour(s))  POC Urinalysis Dipstick OB   Collection Time: 05/31/22 10:40 AM  Result Value Ref Range   Color, UA     Clarity, UA     Glucose, UA Negative Negative   Bilirubin, UA     Ketones, UA negative    Spec Grav, UA     Blood, UA negative    pH, UA     POC,PROTEIN,UA Negative Negative, Trace, Small (1+), Moderate (2+), Large (3+), 4+   Urobilinogen, UA     Nitrite, UA negative    Leukocytes, UA Small (1+) (A) Negative   Appearance     Odor      NST: FHR baseline 140 bpm, Variability: moderate, Accelerations:present, Decelerations:  Absent= Cat 1/reactive Toco: none   ASSESSMENT: [id number] at [redacted]w[redacted]d with Diabetes: A2DM} on metformin NST reactive  PLAN: EFM strip reviewed by Dr. Nelda Marseille   Recommendations: keep next appointment as scheduled    Alice Rieger  05/31/2022 11:28 AM

## 2022-06-03 ENCOUNTER — Encounter: Payer: Self-pay | Admitting: Obstetrics & Gynecology

## 2022-06-04 ENCOUNTER — Other Ambulatory Visit: Payer: Medicaid Other

## 2022-06-04 ENCOUNTER — Telehealth (INDEPENDENT_AMBULATORY_CARE_PROVIDER_SITE_OTHER): Payer: Medicaid Other | Admitting: Obstetrics & Gynecology

## 2022-06-04 ENCOUNTER — Encounter: Payer: Self-pay | Admitting: Obstetrics & Gynecology

## 2022-06-04 VITALS — BP 123/83 | HR 106

## 2022-06-04 DIAGNOSIS — O09523 Supervision of elderly multigravida, third trimester: Secondary | ICD-10-CM

## 2022-06-04 DIAGNOSIS — O24419 Gestational diabetes mellitus in pregnancy, unspecified control: Secondary | ICD-10-CM

## 2022-06-04 DIAGNOSIS — O0993 Supervision of high risk pregnancy, unspecified, third trimester: Secondary | ICD-10-CM

## 2022-06-04 DIAGNOSIS — Z3A36 36 weeks gestation of pregnancy: Secondary | ICD-10-CM

## 2022-06-04 NOTE — Progress Notes (Signed)
    TELEHEALTH OBSTETRICS VISIT ENCOUNTER NOTE  Provider location: [facility] [place] [facility]   Patient location: Home  I connected with Max Waren on 06/04/22 at 10:30 AM EDT by telephone at home and verified that I am speaking with the correct person using two identifiers. Of note, unable to do video encounter due to technical difficulties.    I discussed the limitations, risks, security and privacy concerns of performing an evaluation and management service by telephone and the availability of in person appointments. I also discussed with the patient that there may be a patient responsible charge related to this service. The patient expressed understanding and agreed to proceed.  Subjective:  Victoria Savage is a 42 y.o. G5P4004 at [redacted]w[redacted]d being followed for ongoing prenatal care.  She is currently monitored for the following issues for this high-risk pregnancy and has Supervision of high-risk pregnancy; AMA (advanced maternal age) multigravida 35+; and GDM, class A2 on their problem list.  Patient reports  that she has been feeling sick the past couple of days- notes runny nose, coughing, itchy eyes.  Denies CP/SOB.  Also notes increased urination to the point of incontinence . Reports fetal movement. Denies any contractions, bleeding or leaking of fluid.   Reviewed sugar log- slightly elevated fasting, but overall ok  The following portions of the patient's history were reviewed and updated as appropriate: allergies, current medications, past family history, past medical history, past social history, past surgical history and problem list.   Objective:  Blood pressure 123/83, pulse (!) 106, unknown if currently breastfeeding. General:  Alert, oriented and cooperative.   Mental Status: Normal mood and affect perceived. Normal judgment and thought content.  Rest of physical exam deferred due to type of encounter  Assessment and Plan:  Pregnancy: G5P4004 at  [redacted]w[redacted]d  -GDMA2- no change to current meds -URI- reviewed conservative treatment and precautions -Urinary frequency- if possible pt to come tmr to leave UA sample  -continue routine OB care []  GBS next visit since it could not be completed today  Preterm labor symptoms and general obstetric precautions including but not limited to vaginal bleeding, contractions, leaking of fluid and fetal movement were reviewed in detail with the patient.  I discussed the assessment and treatment plan with the patient. The patient was provided an opportunity to ask questions and all were answered. The patient agreed with the plan and demonstrated an understanding of the instructions. The patient was advised to call back or seek an in-person office evaluation/go to [facility]U at [facility] for any urgent or concerning symptoms. Please refer to After Visit Summary for other counseling recommendations.   I provided 10 minutes of non-face-to-face time during this encounter.  Return for as scheduled.  Future Appointments  Date Time Provider [place]  06/04/2022 10:30 AM Janyth Pupa, DO [facility]  06/11/2022 10:00 AM [facility] - [facility] [place] [facility] None  06/11/2022 10:50 AM Florian Buff, MD [facility]  06/14/2022 10:30 AM [facility] NURSE [facility]  06/18/2022  9:50 AM [facility] NURSE [facility]  06/18/2022 10:10 AM Florian Buff, MD [facility]  06/21/2022  9:30 AM [facility] NURSE [facility]  06/25/2022 10:50 AM [facility] NURSE [facility]  06/25/2022 11:10 AM Janyth Pupa, DO [facility]  06/28/2022 10:30 AM [facility] NURSE [facility]    Annalee Genta, DO [facility] [place] [place]

## 2022-06-05 ENCOUNTER — Other Ambulatory Visit (INDEPENDENT_AMBULATORY_CARE_PROVIDER_SITE_OTHER): Payer: Medicaid Other | Admitting: *Deleted

## 2022-06-05 VITALS — BP 113/78 | HR 103

## 2022-06-05 DIAGNOSIS — R350 Frequency of micturition: Secondary | ICD-10-CM

## 2022-06-05 LAB — POCT URINALYSIS DIPSTICK OB
Blood, UA: NEGATIVE
Glucose, UA: NEGATIVE
Ketones, UA: NEGATIVE
Nitrite, UA: NEGATIVE

## 2022-06-05 NOTE — Progress Notes (Signed)
   NURSE VISIT- UTI SYMPTOMS   SUBJECTIVE:  Victoria Savage is a 42 y.o. G50P4004 female here for UTI symptoms. She is [redacted]w[redacted]d pregnant. She reports urinary frequency and urinary incontinence.  OBJECTIVE:  BP 113/78 (BP Location: Right Arm, Patient Position: Sitting, Cuff Size: Normal)   Pulse (!) 103   LMP  (LMP Unknown)   Appears well, in no apparent distress  Results for orders placed or performed in visit on 06/05/22 (from the past 24 hour(s))  POC Urinalysis Dipstick OB   Collection Time: 06/05/22  4:45 PM  Result Value Ref Range   Color, UA     Clarity, UA     Glucose, UA Negative Negative   Bilirubin, UA     Ketones, UA neg    Spec Grav, UA     Blood, UA neg    pH, UA     POC,PROTEIN,UA Trace Negative, Trace, Small (1+), Moderate (2+), Large (3+), 4+   Urobilinogen, UA     Nitrite, UA neg    Leukocytes, UA Small (1+) (A) Negative   Appearance     Odor      ASSESSMENT: Pregnancy [redacted]w[redacted]d with UTI symptoms and negative nitrites  PLAN: Discussed with Dr. Nelda Marseille   Rx sent by provider today: No Urine culture sent Call or return to clinic prn if these symptoms worsen or fail to improve as anticipated. Follow-up: as scheduled   Janece Canterbury  06/05/2022 4:46 PM

## 2022-06-06 LAB — URINALYSIS
Glucose, UA: NEGATIVE
Nitrite, UA: NEGATIVE
RBC, UA: NEGATIVE
Specific Gravity, UA: 1.022 (ref 1.005–1.030)
Urobilinogen, Ur: 1 mg/dL (ref 0.2–1.0)
pH, UA: 6 (ref 5.0–7.5)

## 2022-06-07 LAB — URINE CULTURE

## 2022-06-10 ENCOUNTER — Other Ambulatory Visit: Payer: Self-pay | Admitting: Advanced Practice Midwife

## 2022-06-10 DIAGNOSIS — O24419 Gestational diabetes mellitus in pregnancy, unspecified control: Secondary | ICD-10-CM

## 2022-06-11 ENCOUNTER — Other Ambulatory Visit (HOSPITAL_COMMUNITY)
Admission: RE | Admit: 2022-06-11 | Discharge: 2022-06-11 | Disposition: A | Discharge status: Home / Self Care | Payer: Medicaid Other | Source: Ambulatory Visit | Attending: Obstetrics & Gynecology | Admitting: Obstetrics & Gynecology

## 2022-06-11 ENCOUNTER — Encounter: Payer: Self-pay | Admitting: Obstetrics & Gynecology

## 2022-06-11 ENCOUNTER — Ambulatory Visit (INDEPENDENT_AMBULATORY_CARE_PROVIDER_SITE_OTHER): Payer: Medicaid Other

## 2022-06-11 ENCOUNTER — Ambulatory Visit (INDEPENDENT_AMBULATORY_CARE_PROVIDER_SITE_OTHER): Payer: Medicaid Other | Admitting: Obstetrics & Gynecology

## 2022-06-11 VITALS — BP 112/76 | HR 110 | Wt 202.0 lb

## 2022-06-11 DIAGNOSIS — O0993 Supervision of high risk pregnancy, unspecified, third trimester: Secondary | ICD-10-CM | POA: Insufficient documentation

## 2022-06-11 DIAGNOSIS — O24419 Gestational diabetes mellitus in pregnancy, unspecified control: Secondary | ICD-10-CM

## 2022-06-11 DIAGNOSIS — Z3A37 37 weeks gestation of pregnancy: Secondary | ICD-10-CM | POA: Insufficient documentation

## 2022-06-11 DIAGNOSIS — O09523 Supervision of elderly multigravida, third trimester: Secondary | ICD-10-CM

## 2022-06-11 NOTE — Progress Notes (Signed)
HIGH-RISK PREGNANCY VISIT Patient name: Victoria Savage MRN [id number]  Date of birth: Jul 24, 1980 Chief Complaint:   Routine Prenatal Visit  History of Present Illness:   Victoria Savage is a 42 y.o. [id number] female at [redacted]w[redacted]d with an Estimated Date of Delivery: 07/02/22 being seen today for ongoing management of a high-risk pregnancy complicated by diabetes mellitus A2DM currently on metformin 500/1000 .    Today she reports no complaints. Contractions: Not present. Vag. Bleeding: None.  Movement: Present. denies leaking of fluid.      01/01/2022    2:30 PM 12/25/2021   12:42 PM 12/25/2021   12:36 PM  Depression screen PHQ 2/9  Decreased Interest 1 1 1   Down, Depressed, Hopeless 3 3 3   PHQ - 2 Score 4 4 4   Altered sleeping 3 3 3   Tired, decreased energy 0 0 0  Change in appetite 0 0 0  Feeling bad or failure about yourself  0 0 0  Trouble concentrating 0 0 0  Moving slowly or fidgety/restless 0 0 0  Suicidal thoughts 0 0 0  PHQ-9 Score 7 7 7         01/01/2022    2:31 PM 12/25/2021   12:42 PM 12/25/2021   12:36 PM  GAD 7 : Generalized Anxiety Score  Nervous, Anxious, on Edge 0 0 0  Control/stop worrying 0 0 0  Worry too much - different things 0 0 0  Trouble relaxing 0 0 0  Restless 0 0 0  Easily annoyed or irritable 0 0 0  Afraid - awful might happen 0 0 0  Total GAD 7 Score 0 0 0     Review of Systems:   Pertinent items are noted in HPI Denies abnormal vaginal discharge w/ itching/odor/irritation, headaches, visual changes, shortness of breath, chest pain, abdominal pain, severe nausea/vomiting, or problems with urination or bowel movements unless otherwise stated above. Pertinent History Reviewed:  Reviewed past medical,surgical, social, obstetrical and family history.  Reviewed problem list, medications and allergies. Physical Assessment:   Vitals:   06/11/22 1105  BP: 112/76  Pulse: (!) 110  Weight: 202 lb (91.6 kg)  Body mass index is 36.95 kg/m.            Physical Examination:   General appearance: alert, well appearing, and in no distress  Mental status: alert, oriented to person, place, and time  Skin: warm & dry   Extremities: Edema: Trace    Cardiovascular: normal heart rate noted  Respiratory: normal respiratory effort, no distress  Abdomen: gravid, soft, non-tender  Pelvic: Cervical exam performed  3.5/50/Ballot        Fetal Status:     Movement: Present    Fetal Surveillance Testing today: BPP 8/8   Chaperone: Latisha Cresenzo    No results found for this or any previous visit (from the past 24 hour(s)).  Assessment & Plan:  High-risk pregnancy: [id number] at [redacted]w[redacted]d with an Estimated Date of Delivery: 07/02/22      ICD-10-CM   1. Supervision of high risk pregnancy in third trimester  O09.93 Cervicovaginal ancillary only    Culture, beta strep (group b only)    2. GDM, class A2  O24.419 US Fetal BPP W/O Non Stress    3. [redacted] weeks gestation of pregnancy  Z3A.37 Cervicovaginal ancillary only    Culture, beta strep (group b only)        Meds: No orders of the defined types were placed in this encounter.   Orders:  Orders Placed This Encounter  Procedures   Culture, beta strep (group b only)     Labs/procedures today: U/S   Follow-up: Return for keep scheduled.   Future Appointments  Date Time Provider [place]  06/14/2022 10:30 AM [facility] NURSE [facility]  06/18/2022  9:50 AM [facility] NURSE [facility]  06/18/2022 10:10 AM Florian Buff, MD [facility]  06/21/2022  9:30 AM [facility] NURSE [facility]  06/25/2022  7:00 AM [facility]-LD SCHED ROOM [facility]-INDC None  06/25/2022 10:50 AM [facility] NURSE [facility]  06/25/2022 11:10 AM Estill Dooms, NP [facility]  06/28/2022 10:30 AM [facility] NURSE [facility]    Orders Placed This Encounter  Procedures   Culture, beta strep (group b only)   Florian Buff  Attending Physician for the [facility] 06/11/2022 11:19 AM

## 2022-06-11 NOTE — Progress Notes (Signed)
[place] 37 wks,cephalic,BPP [id number] bpm,posterior placenta gr 2,AFI 18 cm,EFW 3437 g 85%

## 2022-06-12 ENCOUNTER — Telehealth (HOSPITAL_COMMUNITY): Payer: Self-pay | Admitting: *Deleted

## 2022-06-12 ENCOUNTER — Encounter (HOSPITAL_COMMUNITY): Payer: Self-pay | Admitting: *Deleted

## 2022-06-12 ENCOUNTER — Encounter: Payer: Self-pay | Admitting: Obstetrics & Gynecology

## 2022-06-12 LAB — CERVICOVAGINAL ANCILLARY ONLY
Chlamydia: NEGATIVE
Comment: NEGATIVE
Comment: NORMAL
Neisseria Gonorrhea: NEGATIVE

## 2022-06-12 NOTE — Telephone Encounter (Signed)
Preadmission screen  

## 2022-06-13 ENCOUNTER — Encounter (HOSPITAL_COMMUNITY): Payer: Self-pay | Admitting: Obstetrics and Gynecology

## 2022-06-13 ENCOUNTER — Inpatient Hospital Stay (HOSPITAL_COMMUNITY)
Admission: AD | Admit: 2022-06-13 | Discharge: 2022-06-13 | Disposition: A | Discharge status: Home / Self Care | Payer: Medicaid Other | Attending: Obstetrics and Gynecology | Admitting: Obstetrics and Gynecology

## 2022-06-13 DIAGNOSIS — O471 False labor at or after 37 completed weeks of gestation: Secondary | ICD-10-CM | POA: Diagnosis present

## 2022-06-13 DIAGNOSIS — O479 False labor, unspecified: Secondary | ICD-10-CM

## 2022-06-13 DIAGNOSIS — Z0371 Encounter for suspected problem with amniotic cavity and membrane ruled out: Secondary | ICD-10-CM

## 2022-06-13 DIAGNOSIS — Z3A37 37 weeks gestation of pregnancy: Secondary | ICD-10-CM | POA: Diagnosis not present

## 2022-06-13 LAB — POCT FERN TEST: POCT Fern Test: NEGATIVE

## 2022-06-13 LAB — AMNISURE RUPTURE OF MEMBRANE (ROM) NOT AT ARMC: Amnisure ROM: NEGATIVE

## 2022-06-13 NOTE — MAU Note (Signed)
Victoria Savage is a 42 y.o. at [redacted]w[redacted]d here in MAU reporting: possible rupture of membranes. Pt states she felt a pop around 2130. Pt states she isn't sure if it was her water breaking or not but it was clear fluid.  Pt states she is feeling throbbing and tightening in her stomach that started at the same time as the pop. She isn't sure if they are ctx or not. Pt denies VB. +FM.   Onset of complaint: 2130 06/13/2022 Pain score: 1/10 lower abdomen  Vitals:   06/13/22 2206  BP: 134/85  Pulse: 99  Resp: 16  Temp: 97.7 F (36.5 C)  SpO2: 99%     FHT:158 Lab orders placed from triage:  Maryann Alar

## 2022-06-13 NOTE — MAU Provider Note (Signed)
S: Victoria Savage is a 42 y.o. [id number] at [redacted]w[redacted]d  who presents to MAU today complaining of feeling a pop at 2130. She leaked clear fluid once, but is unsure if it was her water that broke. She denies vaginal bleeding. She  unsure if she is having  contractions. She describes feeling a throbbing and tightening in her stomach that started at the same time as the pop. She reports normal fetal movement.    O: BP 134/85 (BP Location: Right Arm)   Pulse 99   Temp 97.7 F (36.5 C) (Oral)   Resp 16   Ht 5\' 2"  (1.575 m)   Wt 92.6 kg   LMP  (LMP Unknown)   SpO2 99%   BMI 37.33 kg/m  GENERAL: Well-developed, well-nourished female in no acute distress.  HEAD: Normocephalic, atraumatic.  CHEST: Normal effort of breathing, regular heart rate ABDOMEN: Soft, nontender, gravid PELVIC: Amnisure collected by RN using blind swab technique   Cervical exam:   Deferred d/t reporting no pain   Fetal Monitoring: Baseline: 150 Variability: moderate Accelerations: 15 x 15 present Decelerations: absent Contractions: UI noted  Results for orders placed or performed during the hospital encounter of 06/13/22 (from the past 24 hour(s))  Fern Test     Status: None   Collection Time: 06/13/22 10:23 PM  Result Value Ref Range   POCT Fern Test Negative = intact amniotic membranes   Amnisure rupture of membrane (rom)not at [facility]     Status: None   Collection Time: 06/13/22 10:33 PM  Result Value Ref Range   Amnisure ROM NEGATIVE     A: SIUP at [redacted]w[redacted]d  Membranes intact  P: 1. No leakage of amniotic fluid into vagina   2. False labor  3. [redacted] weeks gestation of pregnancy   - Discharge patient - Keep scheduled appt with FT 06/14/2022 - Patient verbalized an understanding of the plan of care and agrees.    Laury Deep, CNM 06/13/2022, 10:58 PM

## 2022-06-14 ENCOUNTER — Ambulatory Visit (INDEPENDENT_AMBULATORY_CARE_PROVIDER_SITE_OTHER): Payer: Medicaid Other | Admitting: *Deleted

## 2022-06-14 VITALS — BP 119/78 | HR 104

## 2022-06-14 DIAGNOSIS — Z3A37 37 weeks gestation of pregnancy: Secondary | ICD-10-CM | POA: Diagnosis not present

## 2022-06-14 DIAGNOSIS — O24419 Gestational diabetes mellitus in pregnancy, unspecified control: Secondary | ICD-10-CM

## 2022-06-14 NOTE — Progress Notes (Signed)
   NURSE VISIT- NST  SUBJECTIVE:  Victoria Savage is a 42 y.o. G70P4004 female at [redacted]w[redacted]d, here for a NST for pregnancy complicated by Diabetes: A2DM}.  She reports active fetal movement, contractions: none, vaginal bleeding: none, membranes: intact.   OBJECTIVE:  BP 119/78   Pulse (!) 104   LMP  (LMP Unknown)   Appears well, no apparent distress  Results for orders placed or performed during the hospital encounter of 06/13/22 (from the past 24 hour(s))  Fern Test   Collection Time: 06/13/22 10:23 PM  Result Value Ref Range   POCT Fern Test Negative = intact amniotic membranes   Amnisure rupture of membrane (rom)not at [facility]   Collection Time: 06/13/22 10:33 PM  Result Value Ref Range   Amnisure ROM NEGATIVE     NST: FHR baseline 135 bpm, Variability: moderate, Accelerations:present, Decelerations:  Absent= Cat 1/reactive Toco: none   ASSESSMENT: G5P4004 at [redacted]w[redacted]d with Diabetes: A2DM} NST reactive  PLAN: EFM strip reviewed by Dr. Despina Hidden   Recommendations: keep next appointment as scheduled    Annamarie Dawley  06/14/2022 12:54 PM

## 2022-06-15 LAB — CULTURE, BETA STREP (GROUP B ONLY): Strep Gp B Culture: NEGATIVE

## 2022-06-18 ENCOUNTER — Ambulatory Visit (INDEPENDENT_AMBULATORY_CARE_PROVIDER_SITE_OTHER): Payer: Medicaid Other | Admitting: Obstetrics & Gynecology

## 2022-06-18 ENCOUNTER — Other Ambulatory Visit: Payer: Medicaid Other

## 2022-06-18 VITALS — BP 121/76 | HR 103 | Wt 205.0 lb

## 2022-06-18 DIAGNOSIS — O24419 Gestational diabetes mellitus in pregnancy, unspecified control: Secondary | ICD-10-CM

## 2022-06-18 DIAGNOSIS — O0993 Supervision of high risk pregnancy, unspecified, third trimester: Secondary | ICD-10-CM | POA: Diagnosis not present

## 2022-06-18 DIAGNOSIS — Z3A38 38 weeks gestation of pregnancy: Secondary | ICD-10-CM | POA: Diagnosis not present

## 2022-06-19 ENCOUNTER — Other Ambulatory Visit: Payer: Self-pay | Admitting: Advanced Practice Midwife

## 2022-06-19 DIAGNOSIS — O24419 Gestational diabetes mellitus in pregnancy, unspecified control: Secondary | ICD-10-CM

## 2022-06-21 ENCOUNTER — Ambulatory Visit (INDEPENDENT_AMBULATORY_CARE_PROVIDER_SITE_OTHER): Payer: Medicaid Other | Admitting: *Deleted

## 2022-06-21 VITALS — BP 115/78 | HR 82 | Wt 209.0 lb

## 2022-06-21 DIAGNOSIS — Z3A38 38 weeks gestation of pregnancy: Secondary | ICD-10-CM | POA: Diagnosis not present

## 2022-06-21 DIAGNOSIS — O09523 Supervision of elderly multigravida, third trimester: Secondary | ICD-10-CM

## 2022-06-21 DIAGNOSIS — O24419 Gestational diabetes mellitus in pregnancy, unspecified control: Secondary | ICD-10-CM | POA: Diagnosis not present

## 2022-06-21 DIAGNOSIS — Z1389 Encounter for screening for other disorder: Secondary | ICD-10-CM

## 2022-06-21 DIAGNOSIS — O0993 Supervision of high risk pregnancy, unspecified, third trimester: Secondary | ICD-10-CM

## 2022-06-21 DIAGNOSIS — O288 Other abnormal findings on antenatal screening of mother: Secondary | ICD-10-CM

## 2022-06-21 DIAGNOSIS — Z331 Pregnant state, incidental: Secondary | ICD-10-CM

## 2022-06-21 LAB — POCT URINALYSIS DIPSTICK OB
Blood, UA: NEGATIVE
Glucose, UA: NEGATIVE
Ketones, UA: 15
Nitrite, UA: NEGATIVE

## 2022-06-21 NOTE — Progress Notes (Signed)
   NURSE VISIT- NST  SUBJECTIVE:  Victoria Savage is a 42 y.o. G71P4004 female at [redacted]w[redacted]d, here for a NST for pregnancy complicated by Diabetes: A2DM} on Metformin. She reports active fetal movement, contractions: none, vaginal bleeding: none, membranes: intact.   OBJECTIVE:  BP 115/78   Pulse 82   Wt 209 lb (94.8 kg)   LMP  (LMP Unknown)   BMI 38.23 kg/m   Appears well, no apparent distress  Results for orders placed or performed in visit on 06/21/22 (from the past 24 hour(s))  POC Urinalysis Dipstick OB   Collection Time: 06/21/22 10:01 AM  Result Value Ref Range   Color, UA     Clarity, UA     Glucose, UA Negative Negative   Bilirubin, UA     Ketones, UA 15    Spec Grav, UA     Blood, UA negative    pH, UA     POC,PROTEIN,UA Small (1+) Negative, Trace, Small (1+), Moderate (2+), Large (3+), 4+   Urobilinogen, UA     Nitrite, UA negative    Leukocytes, UA Small (1+) (A) Negative   Appearance     Odor      NST: FHR baseline 130 bpm, Variability: moderate, Accelerations:present, Decelerations:  Absent= Cat 1/reactive Toco: none   ASSESSMENT: G5P4004 at [redacted]w[redacted]d with Diabetes: A2DM} on Metformin NST reactive  PLAN: EFM strip reviewed by Dr. Charlotta Newton   Recommendations: keep next appointment as scheduled    Victoria Savage  06/21/2022 10:39 AM

## 2022-06-25 ENCOUNTER — Inpatient Hospital Stay (HOSPITAL_COMMUNITY)
Admission: RE | Admit: 2022-06-25 | Discharge: 2022-06-26 | DRG: 807 | Disposition: A | Discharge status: Home / Self Care | Payer: Medicaid Other | Attending: Family Medicine | Admitting: Family Medicine

## 2022-06-25 ENCOUNTER — Other Ambulatory Visit: Payer: Self-pay

## 2022-06-25 ENCOUNTER — Inpatient Hospital Stay (HOSPITAL_COMMUNITY): Payer: Medicaid Other

## 2022-06-25 ENCOUNTER — Other Ambulatory Visit: Payer: Medicaid Other

## 2022-06-25 ENCOUNTER — Encounter: Payer: Medicaid Other | Admitting: Adult Health

## 2022-06-25 ENCOUNTER — Encounter (HOSPITAL_COMMUNITY): Payer: Self-pay | Admitting: Family Medicine

## 2022-06-25 ENCOUNTER — Inpatient Hospital Stay (HOSPITAL_COMMUNITY): Payer: Medicaid Other | Admitting: Anesthesiology

## 2022-06-25 DIAGNOSIS — O09523 Supervision of elderly multigravida, third trimester: Secondary | ICD-10-CM | POA: Diagnosis not present

## 2022-06-25 DIAGNOSIS — O09529 Supervision of elderly multigravida, unspecified trimester: Secondary | ICD-10-CM

## 2022-06-25 DIAGNOSIS — O0993 Supervision of high risk pregnancy, unspecified, third trimester: Secondary | ICD-10-CM | POA: Diagnosis not present

## 2022-06-25 DIAGNOSIS — O9902 Anemia complicating childbirth: Secondary | ICD-10-CM | POA: Diagnosis present

## 2022-06-25 DIAGNOSIS — D649 Anemia, unspecified: Secondary | ICD-10-CM | POA: Diagnosis not present

## 2022-06-25 DIAGNOSIS — O24425 Gestational diabetes mellitus in childbirth, controlled by oral hypoglycemic drugs: Principal | ICD-10-CM | POA: Diagnosis present

## 2022-06-25 DIAGNOSIS — O24419 Gestational diabetes mellitus in pregnancy, unspecified control: Secondary | ICD-10-CM

## 2022-06-25 DIAGNOSIS — O99019 Anemia complicating pregnancy, unspecified trimester: Secondary | ICD-10-CM | POA: Diagnosis present

## 2022-06-25 DIAGNOSIS — Z8632 Personal history of gestational diabetes: Secondary | ICD-10-CM

## 2022-06-25 DIAGNOSIS — Z87891 Personal history of nicotine dependence: Secondary | ICD-10-CM | POA: Diagnosis not present

## 2022-06-25 DIAGNOSIS — Z3A39 39 weeks gestation of pregnancy: Secondary | ICD-10-CM

## 2022-06-25 DIAGNOSIS — O24429 Gestational diabetes mellitus in childbirth, unspecified control: Secondary | ICD-10-CM | POA: Diagnosis not present

## 2022-06-25 DIAGNOSIS — O24424 Gestational diabetes mellitus in childbirth, insulin controlled: Secondary | ICD-10-CM | POA: Diagnosis not present

## 2022-06-25 DIAGNOSIS — Z7982 Long term (current) use of aspirin: Secondary | ICD-10-CM

## 2022-06-25 LAB — GLUCOSE, CAPILLARY
Glucose-Capillary: 92 mg/dL (ref 70–99)
Glucose-Capillary: 77 mg/dL (ref 70–99)
Glucose-Capillary: 54 mg/dL — ABNORMAL LOW (ref 70–99)
Glucose-Capillary: 67 mg/dL — ABNORMAL LOW (ref 70–99)

## 2022-06-25 LAB — TYPE AND SCREEN
ABO/RH(D): A POS
Antibody Screen: NEGATIVE

## 2022-06-25 LAB — CBC
HCT: 26 % — ABNORMAL LOW (ref 36.0–46.0)
Hemoglobin: 8.5 g/dL — ABNORMAL LOW (ref 12.0–15.0)
MCH: 25.4 pg — ABNORMAL LOW (ref 26.0–34.0)
MCHC: 32.7 g/dL (ref 30.0–36.0)
MCV: 77.6 fL — ABNORMAL LOW (ref 80.0–100.0)
Platelets: 297 10*3/uL (ref 150–400)
RBC: 3.35 MIL/uL — ABNORMAL LOW (ref 3.87–5.11)
RDW: 15.2 % (ref 11.5–15.5)
WBC: 12.3 10*3/uL — ABNORMAL HIGH (ref 4.0–10.5)
nRBC: 0.2 % (ref 0.0–0.2)

## 2022-06-25 LAB — RPR: RPR Ser Ql: NONREACTIVE

## 2022-06-25 MED ORDER — ACETAMINOPHEN 325 MG PO TABS: 650.0000 mg | ORAL_TABLET | ORAL | Status: DC | PRN

## 2022-06-25 MED ORDER — PHENYLEPHRINE 80 MCG/ML (10ML) SYRINGE FOR IV PUSH (FOR BLOOD PRESSURE SUPPORT): 80.0000 ug | PREFILLED_SYRINGE | INTRAVENOUS | Status: DC | PRN

## 2022-06-25 MED ORDER — OXYTOCIN-SODIUM CHLORIDE 30-0.9 UT/500ML-% IV SOLN
2.5000 [IU]/h | INTRAVENOUS | Status: DC
  Administered 2022-06-25: 2.5 [IU]/h via INTRAVENOUS

## 2022-06-25 MED ORDER — MAGNESIUM SULFATE 40 GM/1000ML IV SOLN
INTRAVENOUS | Status: AC
  Filled 2022-06-25: qty 1000

## 2022-06-25 MED ORDER — OXYCODONE-ACETAMINOPHEN 5-325 MG PO TABS
1.0000 | ORAL_TABLET | ORAL | Status: DC | PRN
  Filled 2022-06-25 (×2): qty 1

## 2022-06-25 MED ORDER — OXYCODONE-ACETAMINOPHEN 5-325 MG PO TABS
2.0000 | ORAL_TABLET | ORAL | Status: DC | PRN
  Administered 2022-06-26 (×2): 2 via ORAL
  Filled 2022-06-25: qty 2

## 2022-06-25 MED ORDER — FENTANYL-BUPIVACAINE-NACL 0.5-0.125-0.9 MG/250ML-% EP SOLN: 12.0000 mL/h | EPIDURAL | Status: DC | PRN

## 2022-06-25 MED ORDER — ONDANSETRON HCL 4 MG/2ML IJ SOLN: 4.0000 mg | INTRAMUSCULAR | Status: DC | PRN

## 2022-06-25 MED ORDER — DIPHENHYDRAMINE HCL 50 MG/ML IJ SOLN: 12.5000 mg | INTRAMUSCULAR | Status: DC | PRN

## 2022-06-25 MED ORDER — FENTANYL CITRATE (PF) 100 MCG/2ML IJ SOLN: 100.0000 ug | INTRAMUSCULAR | Status: DC | PRN

## 2022-06-25 MED ORDER — LACTATED RINGERS IV SOLN
500.0000 mL | Freq: Once | INTRAVENOUS | Status: AC
  Administered 2022-06-25: 500 mL via INTRAVENOUS

## 2022-06-25 MED ORDER — EPHEDRINE 5 MG/ML INJ: 10.0000 mg | INTRAVENOUS | Status: DC | PRN

## 2022-06-25 MED ORDER — SENNOSIDES-DOCUSATE SODIUM 8.6-50 MG PO TABS
2.0000 | ORAL_TABLET | ORAL | Status: DC
  Administered 2022-06-25: 2 via ORAL
  Filled 2022-06-25: qty 2

## 2022-06-25 MED ORDER — DIBUCAINE (PERIANAL) 1 % EX OINT: 1.0000 | TOPICAL_OINTMENT | CUTANEOUS | Status: DC | PRN

## 2022-06-25 MED ORDER — TERBUTALINE SULFATE 1 MG/ML IJ SOLN: 0.2500 mg | Freq: Once | INTRAMUSCULAR | Status: DC | PRN

## 2022-06-25 MED ORDER — ACETAMINOPHEN 325 MG PO TABS
650.0000 mg | ORAL_TABLET | ORAL | Status: DC | PRN
  Administered 2022-06-25: 650 mg via ORAL
  Filled 2022-06-25: qty 2

## 2022-06-25 MED ORDER — SOD CITRATE-CITRIC ACID 500-334 MG/5ML PO SOLN: 30.0000 mL | ORAL | Status: DC | PRN

## 2022-06-25 MED ORDER — DIPHENHYDRAMINE HCL 25 MG PO CAPS: 25.0000 mg | ORAL_CAPSULE | Freq: Four times a day (QID) | ORAL | Status: DC | PRN

## 2022-06-25 MED ORDER — COCONUT OIL OIL: 1.0000 | TOPICAL_OIL | Status: DC | PRN

## 2022-06-25 MED ORDER — OXYTOCIN-SODIUM CHLORIDE 30-0.9 UT/500ML-% IV SOLN
1.0000 m[IU]/min | INTRAVENOUS | Status: DC
  Administered 2022-06-25: 2 m[IU]/min via INTRAVENOUS
  Filled 2022-06-25: qty 500

## 2022-06-25 MED ORDER — ZOLPIDEM TARTRATE 5 MG PO TABS: 5.0000 mg | ORAL_TABLET | Freq: Every evening | ORAL | Status: DC | PRN

## 2022-06-25 MED ORDER — FERROUS SULFATE 325 (65 FE) MG PO TABS
325.0000 mg | ORAL_TABLET | ORAL | Status: DC
  Administered 2022-06-25: 325 mg via ORAL
  Filled 2022-06-25: qty 1

## 2022-06-25 MED ORDER — PRENATAL MULTIVITAMIN CH
1.0000 | ORAL_TABLET | Freq: Every day | ORAL | Status: DC
  Administered 2022-06-26: 1 via ORAL
  Filled 2022-06-25: qty 1

## 2022-06-25 MED ORDER — LACTATED RINGERS IV SOLN: 500.0000 mL | INTRAVENOUS | Status: DC | PRN

## 2022-06-25 MED ORDER — FENTANYL-BUPIVACAINE-NACL 0.5-0.125-0.9 MG/250ML-% EP SOLN
12.0000 mL/h | EPIDURAL | Status: DC | PRN
  Administered 2022-06-25: 12 mL/h via EPIDURAL
  Filled 2022-06-25: qty 250

## 2022-06-25 MED ORDER — LIDOCAINE HCL (PF) 1 % IJ SOLN
INTRAMUSCULAR | Status: DC | PRN
  Administered 2022-06-25 (×2): 4 mL via EPIDURAL

## 2022-06-25 MED ORDER — OXYTOCIN BOLUS FROM INFUSION
333.0000 mL | Freq: Once | INTRAVENOUS | Status: AC
  Administered 2022-06-25: 333 mL via INTRAVENOUS

## 2022-06-25 MED ORDER — ONDANSETRON HCL 4 MG PO TABS: 4.0000 mg | ORAL_TABLET | ORAL | Status: DC | PRN

## 2022-06-25 MED ORDER — WITCH HAZEL-GLYCERIN EX PADS: 1.0000 | MEDICATED_PAD | CUTANEOUS | Status: DC | PRN

## 2022-06-25 MED ORDER — BENZOCAINE-MENTHOL 20-0.5 % EX AERO: 1.0000 | INHALATION_SPRAY | CUTANEOUS | Status: DC | PRN

## 2022-06-25 MED ORDER — SODIUM CHLORIDE 0.9 % IV SOLN: 250.0000 mL | INTRAVENOUS | Status: DC | PRN

## 2022-06-25 MED ORDER — SODIUM CHLORIDE 0.9% FLUSH
3.0000 mL | Freq: Two times a day (BID) | INTRAVENOUS | Status: DC
  Administered 2022-06-25: 3 mL via INTRAVENOUS

## 2022-06-25 MED ORDER — LIDOCAINE HCL (PF) 1 % IJ SOLN: 30.0000 mL | INTRAMUSCULAR | Status: DC | PRN

## 2022-06-25 MED ORDER — LACTATED RINGERS IV SOLN: 500.0000 mL | Freq: Once | INTRAVENOUS | Status: DC

## 2022-06-25 MED ORDER — SIMETHICONE 80 MG PO CHEW: 80.0000 mg | CHEWABLE_TABLET | ORAL | Status: DC | PRN

## 2022-06-25 MED ORDER — IBUPROFEN 600 MG PO TABS
600.0000 mg | ORAL_TABLET | Freq: Four times a day (QID) | ORAL | Status: DC
  Administered 2022-06-25 – 2022-06-26 (×4): 600 mg via ORAL
  Filled 2022-06-25 (×4): qty 1

## 2022-06-25 MED ORDER — ONDANSETRON HCL 4 MG/2ML IJ SOLN: 4.0000 mg | Freq: Four times a day (QID) | INTRAMUSCULAR | Status: DC | PRN

## 2022-06-25 MED ORDER — SODIUM CHLORIDE 0.9% FLUSH: 3.0000 mL | INTRAVENOUS | Status: DC | PRN

## 2022-06-25 MED ORDER — LACTATED RINGERS IV SOLN: INTRAVENOUS | Status: DC

## 2022-06-25 NOTE — Anesthesia Preprocedure Evaluation (Signed)
Anesthesia Evaluation  Patient identified by MRN, date of birth, ID band Patient awake    Reviewed: Allergy & Precautions, Patient's Chart, lab work & pertinent test results  History of Anesthesia Complications Negative for: history of anesthetic complications  Airway Mallampati: II  TM Distance: >3 FB Neck ROM: Full    Dental no notable dental hx.    Pulmonary former smoker   Pulmonary exam normal        Cardiovascular negative cardio ROS Normal cardiovascular exam     Neuro/Psych negative neurological ROS     GI/Hepatic negative GI ROS, Neg liver ROS,,,  Endo/Other  diabetes, Gestational    Renal/GU negative Renal ROS  negative genitourinary   Musculoskeletal negative musculoskeletal ROS (+)    Abdominal   Peds  Hematology  (+) Blood dyscrasia (Hgb 8.5), anemia   Anesthesia Other Findings Day of surgery medications reviewed with patient.  Reproductive/Obstetrics (+) Pregnancy                              Anesthesia Physical Anesthesia Plan  ASA: 2  Anesthesia Plan: Epidural   Post-op Pain Management:    Induction:   PONV Risk Score and Plan: Treatment may vary due to age or medical condition  Airway Management Planned: Natural Airway  Additional Equipment: Fetal Monitoring  Intra-op Plan:   Post-operative Plan:   Informed Consent: I have reviewed the patients History and Physical, chart, labs and discussed the procedure including the risks, benefits and alternatives for the proposed anesthesia with the patient or authorized representative who has indicated his/her understanding and acceptance.       Plan Discussed with:   Anesthesia Plan Comments:          Anesthesia Quick Evaluation

## 2022-06-25 NOTE — H&P (Signed)
Victoria Savage is a 42 y.o. G67P4004 female at [redacted]w[redacted]d by 10.0wk u/s presenting for IOL due to [facility] .Reports active fetal movement, contractions: irreg & mild, vaginal bleeding: none, membranes: intact.  Initiated prenatal care at [facility] at 14.0 wks.   Most recent u/s: [redacted]w[redacted]d cephalic, post placenta, EFW 85%, nl fluid.   This pregnancy complicated by: # GDMA2 (Metformin 500/1000) # AMA  Prenatal History/Complications:  # term SVD x 4  Past Medical History: Past Medical History:  Diagnosis Date   ADHD (attention deficit hyperactivity disorder)    Anemia    Gestational diabetes    Hypoglycemia     Past Surgical History: History reviewed. No pertinent surgical history.  Obstetrical History: OB History     Gravida  5   Para  4   Term  4   Preterm      AB      Living  4      SAB      IAB      Ectopic      Multiple      Live Births  4           Social History: Social History   Socioeconomic History   Marital status: Married    Spouse name: Not on file   Number of children: Not on file   Years of education: Not on file   Highest education level: Not on file  Occupational History   Not on file  Tobacco Use   Smoking status: Former    Types: Cigarettes   Smokeless tobacco: Not on file  Vaping Use   Vaping Use: Never used  Substance and Sexual Activity   Alcohol use: Not Currently   Drug use: No   Sexual activity: Not Currently    Birth control/protection: None  Other Topics Concern   Not on file  Social History Narrative   Not on file   Social Determinants of Health   Financial Resource Strain: Low Risk  (12/25/2021)   Overall Financial Resource Strain (CARDIA)    Difficulty of Paying Living Expenses: Not hard at all  Food Insecurity: No Food Insecurity (06/25/2022)   Hunger Vital Sign    Worried About Running Out of Food in the Last Year: Never true    Ran Out of Food in the Last Year: Never true  Transportation Needs: No Transportation  Needs (06/25/2022)   PRAPARE - Administrator, Civil Service (Medical): No    Lack of Transportation (Non-Medical): No  Physical Activity: Insufficiently Active (12/25/2021)   Exercise Vital Sign    Days of Exercise per Week: 3 days    Minutes of Exercise per Session: 30 min  Stress: Stress Concern Present (12/25/2021)   [employer] of Occupational Health - Occupational Stress Questionnaire    Feeling of Stress : Rather much  Social Connections: Moderately Isolated (12/25/2021)   Social Connection and Isolation Panel [NHANES]    Frequency of Communication with Friends and Family: Never    Frequency of Social Gatherings with Friends and Family: Never    Attends Religious Services: More than 4 times per year    Active Member of [employer] or Organizations: No    Attends Banker Meetings: Never    Marital Status: Living with partner    Family History: Family History  Problem Relation Age of Onset   Hypertension Mother    Diabetes Mother    Liver cancer Mother    Diabetes Maternal Grandfather  Allergies: Allergies  Allergen Reactions   Sulfa Antibiotics Other (See Comments)    Lowers patients blood sugar. Makes pass out.    Medications Prior to Admission  Medication Sig Dispense Refill Last Dose   aspirin EC 81 MG tablet Take 1 tablet (81 mg total) by mouth daily. Swallow whole. 90 tablet 3 06/24/2022   metFORMIN (GLUCOPHAGE) 500 MG tablet 1 tablet(500 mg) in morning and 2 tablets(1000 mg) at bedtime 90 tablet 3 06/24/2022   omeprazole (PRILOSEC) 20 MG capsule Take 1 capsule (20 mg total) by mouth daily. 1 tablet a day 30 capsule 6 06/24/2022   Prenatal Vit-Fe Fumarate-FA (PRENATAL VITAMIN PO) Take by mouth.   06/24/2022   Accu-Chek Softclix Lancets lancets Use as instructed to check blood sugar 4 times daily 100 each 12    Blood Glucose Monitoring Suppl (ACCU-CHEK GUIDE ME) w/Device KIT 1 each by Does not apply route 4 (four) times daily. 1 kit 0     Blood Pressure Monitor MISC For regular home bp monitoring during pregnancy 1 each 0    glucose blood (ACCU-CHEK GUIDE) test strip Use as instructed to check blood sugar 4 times daily 50 each 12     Review of Systems  Pertinent pos/neg as indicated in HPI  Blood pressure 115/79, pulse 75, temperature 97.9 F (36.6 C), temperature source Oral, resp. rate 18, height  (1.575 m), weight 94.4 kg, SpO2 98 %, unknown if currently breastfeeding. General appearance: alert, cooperative, and no distress; epidural in place Lungs: clear to auscultation bilaterally Heart: regular rate and rhythm Abdomen: gravid, soft, non-tender, EFW by Leopold's approximately 7lbs Extremities: 1+ edema  Fetal monitoring: FHR: 130s bpm, variability: moderate,  Accelerations: Present,  decelerations:  Absent Uterine activity: Frequency: Every 2-4 minutes Dilation: 5 Effacement (%): 70 Station: -2 Exam by:: Philipp Deputy CNM Presentation: cephalic AROM for clear fluid   Prenatal labs: ABO, Rh: --/--/A POS (04/16 0725) Antibody: NEG (04/16 0725) Rubella: 2.03 (10/24 1545) RPR: Non Reactive (01/26 0808)  HBsAg: Negative (10/24 1545)  HIV: Non Reactive (01/26 0808)  GBS: Negative/-- (04/02 1330)  2hr GTT: 111/181/135  Prenatal Transfer Tool  Maternal Diabetes: Yes:  Diabetes Type:  Insulin/Medication controlled Genetic Screening: Normal Maternal Ultrasounds/Referrals: Normal Fetal Ultrasounds or other Referrals:  None Maternal Substance Abuse:  No Significant Maternal Medications:  Meds include: Other: Metformin Significant Maternal Lab Results: None  Results for orders placed or performed during the hospital encounter of 06/25/22 (from the past 24 hour(s))  CBC   Collection Time: 06/25/22  6:59 AM  Result Value Ref Range   WBC 12.3 (H) 4.0 - 10.5 K/uL   RBC 3.35 (L) 3.87 - 5.11 MIL/uL   Hemoglobin 8.5 (L) 12.0 - 15.0 g/dL   HCT 16.1 (L) 09.6 - 04.5 %   MCV 77.6 (L) 80.0 - 100.0 fL   MCH 25.4 (L)  26.0 - 34.0 pg   MCHC 32.7 30.0 - 36.0 g/dL   RDW 40.9 81.1 - 91.4 %   Platelets 297 150 - 400 K/uL   nRBC 0.2 0.0 - 0.2 %  Type and screen   Collection Time: 06/25/22  7:25 AM  Result Value Ref Range   ABO/RH(D) A POS    Antibody Screen NEG    Sample Expiration      06/28/2022,2359 Performed at [facility], [address] [place], [place] [address]   Glucose, capillary   Collection Time: 06/25/22  7:37 AM  Result Value Ref Range   Glucose-Capillary  92 70 - 99 mg/dL     Assessment:  [redacted]w[redacted]d SIUP  G5P4004  GDMA2  Favorable cervix  Cat 1 FHR  GBS Negative/-- (04/02 1330)  Plan:  Admit to L&D  IV pain meds/epidural prn active labor  Pitocin/AROM as IOL method  Anticipate vag delivery   Plans to bottlefeed  Contraception: Nexplanon  Circumcision: yes  Arabella Merles CNM 06/25/2022, 11:07 AM

## 2022-06-25 NOTE — Progress Notes (Addendum)
Labor Progress Note  Victoria Savage is a 42 y.o. G5P4004 at [redacted]w[redacted]d presented for IOL for A2GDM  S: feeling well, no concerns. Feeling ctx intermittently. Had some bloody show  O:  BP 129/82   Pulse 66   Temp 97.9 F (36.6 C) (Oral)   Resp 18   Ht  (1.575 m)   Wt 94.4 kg   LMP  (LMP Unknown)   SpO2 98%   BMI 38.08 kg/m  EFM: 135bpm/Moderate variability/ none accels/ Variable decels Toco: 2-79min  CVE: Dilation: 8 Effacement (%): 90 Station: -1 Presentation: Vertex Exam by:: Ardeen Jourdain RN   A&P: 42 y.o. [id number] [redacted]w[redacted]d  here for IOL A2GDM as above  #Labor: Progressing well. S/p AROM . On pitocin 16. Continue current management, anticipate SVD #Pain: Epidural #FWB: CAT 2 #GBS negative   Vonna Drafts, MD FM PGY1, Faculty practice [facility], [facility] 06/25/22  2:29 PM

## 2022-06-25 NOTE — Discharge Summary (Signed)
Postpartum Discharge Summary  Date of Service updated***     Patient Name: Victoria Savage DOB: 07/28/1980 MRN: [id number]  Date of admission: 06/25/2022 Delivery date:06/25/2022  Delivering provider:   Date of discharge: 06/25/2022  Admitting diagnosis: GDM, class A2 [O24.419] Intrauterine pregnancy: [redacted]w[redacted]d     Secondary diagnosis:  Principal Problem:   GDM, class A2 Active Problems:   AMA (advanced maternal age) multigravida 35+   Anemia in pregnancy  Additional problems: none    Discharge diagnosis: Term Pregnancy Delivered and GDM A2                                              Post partum procedures: Nexplanon placed  *** Augmentation: AROM and Pitocin Complications: None  Hospital course: Induction of Labor With Vaginal Delivery   42 y.o. yo [id number] at [redacted]w[redacted]d was admitted to the hospital 06/25/2022 for induction of labor.  Indication for induction: A2 DM.  Patient had an uncomplicated labor course.   Membrane Rupture Time/Date: 10:11 AM ,06/25/2022   Delivery Method:Vaginal, Spontaneous  Episiotomy: None  Lacerations:  None  Details of delivery can be found in separate delivery note.  Patient had a postpartum course complicated by***. Her PPD#1 fasting CBG was ***. Patient is discharged home 06/25/22.  Newborn Data: Birth date:06/25/2022  Birth time:2:51 PM  Gender:Female  Living status:Living  Apgars:6 ,9  Weight:3650 g (8lb 0.8oz)  Magnesium Sulfate received: No BMZ received: No Rhophylac:N/A MMR:N/A T-DaP:Given prenatally Flu: No Transfusion:No  Physical exam  Vitals:   06/25/22 1354 06/25/22 1401 06/25/22 1431 06/25/22 1501  BP:  129/82 130/86 122/75  Pulse:  66 78 69  Resp:      Temp: 97.9 F (36.6 C)     TempSrc: Oral     SpO2:      Weight:      Height:       General: {Exam; general:21111117} Lochia: {Desc; appropriate/inappropriate:30686::"appropriate"} Uterine Fundus: {Desc; firm/soft:30687} Incision: {Exam; incision:21111123} DVT Evaluation:  {Exam; dvt:2111122} Labs: Lab Results  Component Value Date   WBC 12.3 (H) 06/25/2022   HGB 8.5 (L) 06/25/2022   HCT 26.0 (L) 06/25/2022   MCV 77.6 (L) 06/25/2022   PLT 297 06/25/2022      Latest Ref Rng & Units 07/29/2012   11:59 AM  CMP  Total Protein 6.0 - 8.3 g/dL 6.8   Total Bilirubin 0.3 - 1.2 mg/dL 0.3   Alkaline Phos 39 - 117 U/L 62   AST 0 - 37 U/L 13   ALT 0 - 35 U/L 16    Edinburgh Score:     No data to display           After visit meds:  Allergies as of 06/25/2022       Reactions   Sulfa Antibiotics Other (See Comments)   Lowers patients blood sugar. Makes pass out.     Med Rec must be completed prior to using this [facility]***        Discharge home in stable condition Infant Feeding: Bottle Infant Disposition:{CHL IP OB HOME WITH ZHYQMV:78469} Discharge instruction: per After Visit Summary and Postpartum booklet. Activity: Advance as tolerated. Pelvic rest for 6 weeks.  Diet: routine diet Future Appointments:No future appointments. Follow up Visit:  Arabella Merles, CNM  Myrle Sheng R Please schedule this patient for Postpartum visit in: 6 weeks with the following  provider: Any provider In-Person For C/S patients schedule nurse incision check in weeks 2 weeks: no High risk pregnancy complicated by: GDMA2 Delivery mode:  SVD Anticipated Birth Control:  PP Nexplanon placed PP Procedures needed: 2 hour GTT Schedule Integrated BH visit: no   06/25/2022 Arabella Merles, CNM

## 2022-06-25 NOTE — Anesthesia Procedure Notes (Signed)
Epidural Patient location during procedure: OB Start time: 06/25/2022 9:04 AM End time: 06/25/2022 9:07 AM  Staffing Anesthesiologist: Kaylyn Layer, MD Performed: anesthesiologist   Preanesthetic Checklist Completed: patient identified, IV checked, risks and benefits discussed, monitors and equipment checked, pre-op evaluation and timeout performed  Epidural Patient position: sitting Prep: DuraPrep and site prepped and draped Patient monitoring: continuous pulse ox, blood pressure and heart rate Approach: midline Location: L3-L4 Injection technique: LOR air  Needle:  Needle type: Tuohy  Needle gauge: 17 G Needle length: 9 cm Catheter type: closed end flexible Catheter size: 19 Gauge Catheter at skin depth: 10 cm Test dose: negative and Other (1% lidocaine)  Assessment Events: blood not aspirated, no cerebrospinal fluid, injection not painful, no injection resistance, no paresthesia and negative IV test  Additional Notes Patient identified. Risks, benefits, and alternatives discussed with patient including but not limited to bleeding, infection, nerve damage, paralysis, failed block, incomplete pain control, headache, blood pressure changes, nausea, vomiting, reactions to medication, itching, and postpartum back pain. Confirmed with bedside nurse the patient's most recent platelet count. Confirmed with patient that they are not currently taking any anticoagulation, have any bleeding history, or any family history of bleeding disorders. Patient expressed understanding and wished to proceed. All questions were answered. Sterile technique was used throughout the entire procedure. Please see nursing notes for vital signs.   Crisp LOR on first pass. Test dose was given through epidural catheter and negative prior to continuing to dose epidural or start infusion. Warning signs of high block given to the patient including shortness of breath, tingling/numbness in hands, complete motor  block, or any concerning symptoms with instructions to call for help. Patient was given instructions on fall risk and not to get out of bed. All questions and concerns addressed with instructions to call with any issues or inadequate analgesia.  Reason for block:procedure for pain

## 2022-06-26 LAB — GLUCOSE, CAPILLARY: Glucose-Capillary: 96 mg/dL (ref 70–99)

## 2022-06-26 MED ORDER — IBUPROFEN 600 MG PO TABS: 600.0000 mg | ORAL_TABLET | Freq: Four times a day (QID) | ORAL | 3 refills | Status: AC | PRN

## 2022-06-26 NOTE — Progress Notes (Signed)
Post Partum Day 1 Subjective: Pt has no complaints this morning Pain controlled. Tolerating diet  Objective: Blood pressure 123/82, pulse 78, temperature 98 F (36.7 C), temperature source Oral, resp. rate 18, height  (1.575 m), weight 94.4 kg, SpO2 96 %, unknown if currently breastfeeding.  Physical Exam:  General: alert Lochia: appropriate Uterine Fundus: firm Incision: NA DVT Evaluation: No evidence of DVT seen on physical exam.  Recent Labs    06/25/22 0659  HGB 8.5*  HCT 26.0*    Assessment/Plan: Plan for discharge tomorrow  Attending Circumcision Counseling Progress Note  Patient desires circumcision for her female infant.  Circumcision procedure details discussed, risks and benefits of procedure were also discussed.  These include but are not limited to: Benefits of circumcision in men include reduction in the rates of urinary tract infection (UTI), penile cancer, some sexually transmitted infections, penile inflammatory and retractile disorders, as well as easier hygiene.  Risks include bleeding , infection, injury of glans which may lead to penile deformity or urinary tract issues, unsatisfactory cosmetic appearance and other potential complications related to the procedure.  It was emphasized that this is an elective procedure.  Patient wants to proceed with circumcision; written informed consent obtained.  Will do circumcision soon, routine circumcision and post circumcision care ordered for the infant.    LOS: 1 day   Hermina Staggers, MD 06/26/2022, 7:54 AM

## 2022-06-27 NOTE — Anesthesia Postprocedure Evaluation (Signed)
Anesthesia Post Note  Patient: Victoria Savage  Procedure(s) Performed: AN AD HOC LABOR EPIDURAL     Patient location during evaluation: Mother Baby Anesthesia Type: Epidural Level of consciousness: awake and alert and oriented Pain management: satisfactory to patient Vital Signs Assessment: post-procedure vital signs reviewed and stable Respiratory status: respiratory function stable Cardiovascular status: stable Postop Assessment: no headache, no backache, epidural receding, patient able to bend at knees, no signs of nausea or vomiting, adequate PO intake and able to ambulate Anesthetic complications: no   No notable events documented.  Last Vitals:  Vitals:   06/26/22 0729 06/26/22 1156  BP: 112/73 129/89  Pulse: 92 81  Resp: 18 17  Temp: 36.9 C 36.9 C  SpO2:  97%    Last Pain:  Vitals:   06/26/22 1156  TempSrc: Oral  PainSc:    Pain Goal:                   Reymond Maynez

## 2022-06-28 ENCOUNTER — Other Ambulatory Visit: Payer: Medicaid Other

## 2022-06-28 LAB — BIRTH TISSUE RECOVERY COLLECTION (PLACENTA DONATION)

## 2022-07-01 ENCOUNTER — Encounter: Payer: Self-pay | Admitting: Women's Health

## 2022-07-02 ENCOUNTER — Encounter: Payer: Self-pay | Admitting: *Deleted

## 2022-07-03 ENCOUNTER — Other Ambulatory Visit: Payer: Self-pay | Admitting: Advanced Practice Midwife

## 2022-07-03 ENCOUNTER — Ambulatory Visit (INDEPENDENT_AMBULATORY_CARE_PROVIDER_SITE_OTHER): Payer: Medicaid Other | Admitting: *Deleted

## 2022-07-03 VITALS — BP 129/88 | HR 62

## 2022-07-03 DIAGNOSIS — Z0130 Encounter for examination of blood pressure without abnormal findings: Secondary | ICD-10-CM

## 2022-07-03 DIAGNOSIS — O165 Unspecified maternal hypertension, complicating the puerperium: Secondary | ICD-10-CM

## 2022-07-03 MED ORDER — TRIAMTERENE-HCTZ 37.5-25 MG PO TABS: 1.0000 | ORAL_TABLET | Freq: Every day | ORAL | 2 refills | Status: AC

## 2022-07-03 NOTE — Progress Notes (Signed)
   NURSE VISIT- BLOOD PRESSURE CHECK  SUBJECTIVE:  Victoria Savage is a 42 y.o. [id number] female here for BP check. She is postpartum, delivery date 06/27/22 . Checked her BP yesterday and noted readings 145/83 and 137/90. She was advised to come to the office for bp check.  HYPERTENSION ROS:  Postpartum:  Severe headaches that don't go away with tylenol/other medicines: slight headache that feels like pressure, possibly allergy related Visual changes (seeing spots/double/blurred vision) No  Severe pain under right breast breast or in center of upper chest No  Severe nausea/vomiting No  Taking medicines as instructed not applicable   OBJECTIVE:  BP 129/88 (BP Location: Left Arm, Patient Position: Sitting, Cuff Size: Normal)   Pulse 62   LMP  (LMP Unknown)   Breastfeeding No   Appearance alert, well appearing, and in no distress.  ASSESSMENT: Postpartum  blood pressure check  PLAN: Discussed with Philipp Deputy, CNM   Recommendations: new prescription will be sent, stop 2 days before PP visit  Follow-up: as scheduled   Jobe Marker  07/03/2022 11:12 AM

## 2022-07-28 NOTE — Progress Notes (Signed)
HIGH-RISK PREGNANCY VISIT Patient name: Victoria Savage MRN [id number]  Date of birth: Aug 02, 1980 Chief Complaint:   Routine Prenatal Visit (NST)  History of Present Illness:   Victoria Savage is a 42 y.o. [id number] female at [redacted]w[redacted]d with an Estimated Date of Delivery: 07/02/22 being seen today for ongoing management of a high-risk pregnancy complicated by A2DM on metformin 500/1000.    Today she reports no complaints. Contractions: Not present. Vag. Bleeding: None.  Movement: Present. denies leaking of fluid.      01/01/2022    2:30 PM 12/25/2021   12:42 PM 12/25/2021   12:36 PM  Depression screen PHQ 2/9  Decreased Interest 1 1 1   Down, Depressed, Hopeless 3 3 3   PHQ - 2 Score 4 4 4   Altered sleeping 3 3 3   Tired, decreased energy 0 0 0  Change in appetite 0 0 0  Feeling bad or failure about yourself  0 0 0  Trouble concentrating 0 0 0  Moving slowly or fidgety/restless 0 0 0  Suicidal thoughts 0 0 0  PHQ-9 Score 7 7 7         01/01/2022    2:31 PM 12/25/2021   12:42 PM 12/25/2021   12:36 PM  GAD 7 : Generalized Anxiety Score  Nervous, Anxious, on Edge 0 0 0  Control/stop worrying 0 0 0  Worry too much - different things 0 0 0  Trouble relaxing 0 0 0  Restless 0 0 0  Easily annoyed or irritable 0 0 0  Afraid - awful might happen 0 0 0  Total GAD 7 Score 0 0 0     Review of Systems:   Pertinent items are noted in HPI Denies abnormal vaginal discharge w/ itching/odor/irritation, headaches, visual changes, shortness of breath, chest pain, abdominal pain, severe nausea/vomiting, or problems with urination or bowel movements unless otherwise stated above. Pertinent History Reviewed:  Reviewed past medical,surgical, social, obstetrical and family history.  Reviewed problem list, medications and allergies. Physical Assessment:   Vitals:   06/18/22 0929  BP: 121/76  Pulse: (!) 103  Weight: 205 lb (93 kg)  Body mass index is 37.49 kg/m.           Physical Examination:    General appearance: alert, well appearing, and in no distress  Mental status: alert, oriented to person, place, and time  Skin: warm & dry   Extremities:      Cardiovascular: normal heart rate noted  Respiratory: normal respiratory effort, no distress  Abdomen: gravid, soft, non-tender  Pelvic: Cervical exam deferred         Fetal Status:     Movement: Present    Fetal Surveillance Testing today: Reactive NST  Victoria Savage is at 108w0d Estimated Date of Delivery: 07/02/22  NST being performed due to A2 DM on metformin 500/1000  Today the NST is Reactive  Fetal Monitoring:  Baseline: 140 bpm, Variability: Good {> 6 bpm), Accelerations: Reactive, and Decelerations: Absent   reactive  The accelerations are >15 bpm and more than 2 in 20 minutes  Final diagnosis:  Reactive NST  Victoria Arms, MD     Chaperone: N/A    No results found for this or any previous visit (from the past 24 hour(s)).  Assessment & Plan:  High-risk pregnancy: G5P5005 at [redacted]w[redacted]d with an Estimated Date of Delivery: 07/02/22      ICD-10-CM   1. Supervision of high risk pregnancy in third trimester  O09.93  2. GDM, class A2  O24.419         Meds: No orders of the defined types were placed in this encounter.   Orders: No orders of the defined types were placed in this encounter.    Labs/procedures today: NST  Treatment Plan:  IOL 39 weeks, twice weekly surveillance  Reviewed: Term labor symptoms and general obstetric precautions including but not limited to vaginal bleeding, contractions, leaking of fluid and fetal movement were reviewed in detail with the patient.  All questions were answered. Does have home bp cuff. Office bp cuff given: not applicable. Check bp weekly, let us know if consistently >140 and/or >90.  Follow-up: No follow-ups on file.     No orders of the defined types were placed in this encounter.  Victoria Savage  Attending Physician for the Center for [facility]

## 2022-07-30 ENCOUNTER — Other Ambulatory Visit: Payer: Medicaid Other

## 2022-07-30 ENCOUNTER — Encounter: Payer: Self-pay | Admitting: Women's Health

## 2022-07-30 ENCOUNTER — Ambulatory Visit (INDEPENDENT_AMBULATORY_CARE_PROVIDER_SITE_OTHER): Payer: Medicaid Other | Admitting: Women's Health

## 2022-07-30 DIAGNOSIS — Z30017 Encounter for initial prescription of implantable subdermal contraceptive: Secondary | ICD-10-CM | POA: Diagnosis not present

## 2022-07-30 DIAGNOSIS — Z131 Encounter for screening for diabetes mellitus: Secondary | ICD-10-CM | POA: Diagnosis not present

## 2022-07-30 DIAGNOSIS — Z8632 Personal history of gestational diabetes: Secondary | ICD-10-CM

## 2022-07-30 MED ORDER — ETONOGESTREL 68 MG ~~LOC~~ IMPL
68.0000 mg | DRUG_IMPLANT | Freq: Once | SUBCUTANEOUS | Status: AC
Start: 2022-07-30 — End: 2022-07-30
  Administered 2022-07-30: 68 mg via SUBCUTANEOUS

## 2022-07-30 NOTE — Progress Notes (Addendum)
POSTPARTUM VISIT Patient name: Victoria Savage MRN [id number]  Date of birth: 02/23/1981 Chief Complaint:   Postpartum Care  History of Present Illness:   Morningstar Borns is a 42 y.o. [id number] Caucasian female being seen today for a postpartum visit. She is 5 weeks postpartum following a spontaneous vaginal delivery at 39.0 gestational weeks. IOL: yes, for diabetes mellitus A2DM . Anesthesia: epidural.  Laceration: 1st degree.  Complications: none. Inpatient contraception: no.   Pregnancy complicated by AMA, A2DM . Tobacco use: former . Substance use disorder: no. Last pap smear: 02/07/22 and results were NILM w/ HRHPV negative. Next pap smear due: 2026 No LMP recorded (lmp unknown).  Postpartum course has been uncomplicated. Bleeding none. Bowel function is normal. Bladder function is normal. Urinary incontinence? no, fecal incontinence? no Patient is not sexually active. Last sexual activity: prior to birth of baby. Desired contraception: Nexplanon. Patient does not know want a pregnancy in the future.  Desired family size is 5 children.   Upstream - 07/30/22 0836       Pregnancy Intention Screening   Does the patient want to become pregnant in the next year? No    Does the patient's partner want to become pregnant in the next year? No    Would the patient like to discuss contraceptive options today? Yes      Contraception Wrap Up   Current Method Abstinence    Contraception Counseling Provided Yes            The pregnancy intention screening data noted above was reviewed. Potential methods of contraception were discussed. The patient elected to proceed with No data recorded.  Edinburgh Postpartum Depression Screening: negative  Edinburgh Postnatal Depression Scale - 07/30/22 0835       Edinburgh Postnatal Depression Scale:  In the Past 7 Days   I have been able to laugh and see the funny side of things. 0    I have looked forward with enjoyment to things. 0    I have blamed  myself unnecessarily when things went wrong. 0    I have been anxious or worried for no good reason. 0    I have felt scared or panicky for no good reason. 0    Things have been getting on top of me. 0    I have been so unhappy that I have had difficulty sleeping. 0    I have felt sad or miserable. 0    I have been so unhappy that I have been crying. 0    The thought of harming myself has occurred to me. 0    Edinburgh Postnatal Depression Scale Total 0                01/01/2022    2:31 PM 12/25/2021   12:42 PM 12/25/2021   12:36 PM  GAD 7 : Generalized Anxiety Score  Nervous, Anxious, on Edge 0 0 0  Control/stop worrying 0 0 0  Worry too much - different things 0 0 0  Trouble relaxing 0 0 0  Restless 0 0 0  Easily annoyed or irritable 0 0 0  Afraid - awful might happen 0 0 0  Total GAD 7 Score 0 0 0     Baby's course has been uncomplicated. Baby is feeding by bottle. Infant has a pediatrician/family doctor? Yes.  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  Pt has material needs met for her and baby: Yes.   Review of Systems:  Pertinent items are noted in HPI Denies Abnormal vaginal discharge w/ itching/odor/irritation, headaches, visual changes, shortness of breath, chest pain, abdominal pain, severe nausea/vomiting, or problems with urination or bowel movements. Pertinent History Reviewed:  Reviewed past medical,surgical, obstetrical and family history.  Reviewed problem list, medications and allergies. OB History  Gravida Para Term Preterm AB Living  5 5 5     5   SAB IAB Ectopic Multiple Live Births        0 5    # Outcome Date GA Lbr Len/2nd Weight Sex Delivery Anes PTL Lv  5 Term 06/25/22 [redacted]w[redacted]d 04:29 / 00:11 8 lb 0.8 oz (3.65 kg) M Vag-Spont EPI  LIV     Birth Comments: significant facial bruising from delivery  4 Term 04/21/15 [redacted]w[redacted]d  6 lb 12 oz (3.062 kg) M Vag-Spont None N LIV     Birth Comments: System Generated. Please review and  update pregnancy details.  3 Term 12/10/12 [redacted]w[redacted]d  7 lb 14 oz (3.572 kg) M Vag-Spont EPI N LIV  2 Term 07/28/09 [redacted]w[redacted]d  7 lb 14 oz (3.572 kg) M Vag-Spont EPI N LIV  1 Term 07/28/05 [redacted]w[redacted]d  8 lb 1 oz (3.657 kg) F Vag-Spont EPI N LIV   Physical Assessment:   Vitals:   07/30/22 0834  BP: 112/71  Pulse: (!) 108  Weight: 180 lb 9.6 oz (81.9 kg)  Height: 5\' 2"  (1.575 m)  Body mass index is 33.03 kg/m.       Physical Examination:   General appearance: alert, well appearing, and in no distress  Mental status: alert, oriented to person, place, and time  Skin: warm & dry   Cardiovascular: normal heart rate noted   Respiratory: normal respiratory effort, no distress   Breasts: deferred, no complaints   Abdomen: soft, non-tender   Pelvic: exam not indicated; thin prep pap obtained: No  Rectal: not examined  Extremities: Edema: none   Chaperone: N/A         No results found for this or any previous visit (from the past 24 hour(s)).   NEXPLANON INSERTION  Risks/benefits/side effects of Nexplanon have been discussed and her questions have been answered.  Specifically, a failure rate of 03/998 has been reported, with an increased failure rate if pt takes St. John's Wort and/or antiseizure medicaitons.  She is aware of the common side effect of irregular bleeding, which the incidence of decreases over time. Signed copy of informed consent in chart.   Time out was performed.  She is right-handed, so her left arm, approximately 10cm from the medial epicondyle and 3-5cm posterior to the sulcus, was cleansed with alcohol and anesthetized with 2cc of 2% Lidocaine.  The area was cleansed again with betadine and the Nexplanon was inserted per manufacturer's recommendations without difficulty.  3 steri-strips and pressure bandage were applied. The patient tolerated the procedure well.   Assessment & Plan:  1) Postpartum exam 2) 5 wks s/p spontaneous vaginal delivery after IOL for A2DM 3) bottle  feeding 4) Depression screening 5) Nexplanon insertion Pt was instructed to keep the area clean and dry, remove pressure bandage in 24 hours, and keep insertion site covered with the steri-strip for 3-5 days.  Condoms for 2 weeks.  She was given a card indicating date Nexplanon was inserted and date it needs to be removed. Follow-up PRN problems. 6) A2DM> 2hr GTT today  Essential components of care per ACOG recommendations:  1.  Mood and well being:  If positive depression screen,  discussed and plan developed.  If using tobacco we discussed reduction/cessation and risk of relapse If current substance abuse, we discussed and referral to local resources was offered.   2. Infant care and feeding:  If breastfeeding, discussed returning to work, pumping, breastfeeding-associated pain, guidance regarding return to fertility while lactating if not using another method. If needed, patient was provided with a letter to be allowed to pump q 2-3hrs to support lactation in a private location with access to a refrigerator to store breastmilk.   Recommended that all caregivers be immunized for flu, pertussis and other preventable communicable diseases If pt does not have material needs met for her/baby, referred to local resources for help obtaining these.  3. Sexuality, contraception and birth spacing Provided guidance regarding sexuality, management of dyspareunia, and resumption of intercourse Discussed avoiding interpregnancy interval <11mths and recommended birth spacing of 18 months  4. Sleep and fatigue Discussed coping options for fatigue and sleep disruption Encouraged family/partner/community support of 4 hrs of uninterrupted sleep to help with mood and fatigue  5. Physical recovery  If pt had a C/S, assessed incisional pain and providing guidance on normal vs prolonged recovery If pt had a laceration, perineal healing and pain reviewed.  If urinary or fecal incontinence, discussed management  and referred to PT or uro/gyn if indicated  Patient is safe to resume physical activity. Discussed attainment of healthy weight.  6.  Chronic disease management Discussed pregnancy complications if any, and their implications for future childbearing and long-term maternal health. Review recommendations for prevention of recurrent pregnancy complications, such as 17 hydroxyprogesterone caproate to reduce risk for recurrent PTB not applicable, or aspirin to reduce risk of preeclampsia not applicable. Pt had GDM: Yes. If yes, 2hr GTT scheduled: doing today. Reviewed medications and non-pregnant dosing including consideration of whether pt is breastfeeding using a reliable resource such as LactMed: not applicable Referred for f/u w/ PCP or subspecialist providers as indicated: not applicable  7. Health maintenance Mammogram at 40yo or earlier if indicated Pap smears as indicated  Meds: No orders of the defined types were placed in this encounter.   Follow-up: No follow-ups on file.   No orders of the defined types were placed in this encounter.   Cheral Marker CNM, [facility] 07/30/2022 9:06 AM

## 2022-07-30 NOTE — Patient Instructions (Signed)
Keep the area clean and dry.  You can remove the big bandage in 24 hours, and the small steri-strip bandage in 3-5 days.  A back up method, such as condoms, should be used for two weeks. You may have irregular vaginal bleeding for the first 6 months after the Nexplanon is placed, then the bleeding usually lightens and it is possible that you may not have any periods.  If you have any concerns, please give us a call.    Etonogestrel Implant What is this medication? ETONOGESTREL (et oh noe JES trel) prevents ovulation and pregnancy. It belongs to a group of medications called contraceptives. This medication is a progestin hormone. This medicine may be used for other purposes; ask your health care provider or pharmacist if you have questions. COMMON BRAND NAME(S): Implanon, Nexplanon What should I tell my care team before I take this medication? They need to know if you have any of these conditions: Abnormal vaginal bleeding Blood clots Blood vessel disease Breast, cervical, endometrial, ovarian, liver, or uterine cancer Diabetes Gallbladder disease Heart disease or recent heart attack High blood pressure High cholesterol or triglycerides Kidney disease Liver disease Migraine headaches Seizures Stroke Tobacco use An unusual or allergic reaction to etonogestrel, other medications, foods, dyes, or preservatives Pregnant or trying to get pregnant Breastfeeding How should I use this medication? This device is inserted just under the skin on the inner side of your upper arm by your care team. Talk to your care team about the use of this medication in children. Special care may be needed. Overdosage: If you think you have taken too much of this medicine contact a poison control center or emergency room at once. NOTE: This medicine is only for you. Do not share this medicine with others. What if I miss a dose? This does not apply. What may interact with this medication? Do not take this  medication with any of the following: Amprenavir Fosamprenavir This medication may also interact with the following: Acitretin Aprepitant Armodafinil Bexarotene Bosentan Carbamazepine Certain antivirals for HIV or hepatitis Certain medications for fungal infections, such as fluconazole, ketoconazole, itraconazole, or voriconazole Cyclosporine Felbamate Griseofulvin Lamotrigine Modafinil Oxcarbazepine Phenobarbital Phenytoin Primidone Rifabutin Rifampin Rifapentine St. John's wort Topiramate This list may not describe all possible interactions. Give your health care provider a list of all the medicines, herbs, non-prescription drugs, or dietary supplements you use. Also tell them if you smoke, drink alcohol, or use illegal drugs. Some items may interact with your medicine. What should I watch for while using this medication? Visit your care team for regular checks on your progress. Using this medication does not protect you or your partner against HIV or other sexually transmitted infections (STIs). You should be able to feel the implant by pressing your fingertips over the skin where it was inserted. Contact your care team if you cannot feel the implant, and use a non-hormonal birth control method (such as condoms) until your care team confirms that the implant is in place. Contact your care team if you think that the implant may have broken or become bent while in your arm. You will receive a user card from your care team after the implant is inserted. The card is a record of the location of the implant in your upper arm and when it should be removed. Keep this card with your health records. What side effects may I notice from receiving this medication? Side effects that you should report to your care team as soon as   possible: Allergic reactions--skin rash, itching, hives, swelling of the face, lips, tongue, or throat Blood clot--pain, swelling, or warmth in the leg, shortness of  breath, chest pain Gallbladder problems--severe stomach pain, nausea, vomiting, fever Increase in blood pressure Liver injury--right upper belly pain, loss of appetite, nausea, light-colored stool, dark yellow or brown urine, yellowing skin or eyes, unusual weakness or fatigue New or worsening migraines or headaches Pain, redness, or irritation at injection site Stroke--sudden numbness or weakness of the face, arm, or leg, trouble speaking, confusion, trouble walking, loss of balance or coordination, dizziness, severe headache, change in vision Unusual vaginal discharge, itching, or odor Worsening mood, feelings of depression Side effects that usually do not require medical attention (report to your care team if they continue or are bothersome): Breast pain or tenderness Dark patches of skin on the face or other sun-exposed areas Irregular menstrual cycles or spotting Nausea Weight gain This list may not describe all possible side effects. Call your doctor for medical advice about side effects. You may report side effects to FDA at 1-800-FDA-1088. Where should I keep my medication? This medication is given in a hospital or clinic and will not be stored at home. NOTE: This sheet is a summary. It may not cover all possible information. If you have questions about this medicine, talk to your doctor, pharmacist, or health care provider.  2023 Elsevier/Gold Standard (2021-10-02 00:00:00)  

## 2022-07-30 NOTE — Addendum Note (Signed)
Addended by: Moss Mc on: 07/30/2022 09:14 AM   Modules accepted: Orders

## 2022-07-31 LAB — GLUCOSE TOLERANCE, 2 HOURS W/ 1HR
Glucose, 1 hour: 83 mg/dL (ref 70–179)
Glucose, 2 hour: 112 mg/dL (ref 70–152)
Glucose, Fasting: 86 mg/dL (ref 70–91)

## 2023-02-02 ENCOUNTER — Other Ambulatory Visit: Payer: Self-pay | Admitting: Medical Genetics

## 2023-02-02 DIAGNOSIS — Z006 Encounter for examination for normal comparison and control in clinical research program: Secondary | ICD-10-CM

## 2023-06-22 DIAGNOSIS — H5213 Myopia, bilateral: Secondary | ICD-10-CM | POA: Diagnosis not present

## 2023-12-19 ENCOUNTER — Other Ambulatory Visit: Payer: Self-pay | Admitting: *Deleted

## 2023-12-19 DIAGNOSIS — Z006 Encounter for examination for normal comparison and control in clinical research program: Secondary | ICD-10-CM

## 2023-12-23 DIAGNOSIS — L299 Pruritus, unspecified: Secondary | ICD-10-CM | POA: Diagnosis not present

## 2024-01-04 ENCOUNTER — Emergency Department (HOSPITAL_COMMUNITY)
Admission: EM | Admit: 2024-01-04 | Discharge: 2024-01-04 | Disposition: A | Discharge status: Home / Self Care | Attending: Emergency Medicine | Admitting: Emergency Medicine

## 2024-01-04 ENCOUNTER — Encounter (HOSPITAL_COMMUNITY): Payer: Self-pay

## 2024-01-04 ENCOUNTER — Other Ambulatory Visit: Payer: Self-pay

## 2024-01-04 DIAGNOSIS — R21 Rash and other nonspecific skin eruption: Secondary | ICD-10-CM | POA: Diagnosis not present

## 2024-01-04 DIAGNOSIS — B86 Scabies: Secondary | ICD-10-CM | POA: Diagnosis not present

## 2024-01-04 MED ORDER — PERMETHRIN 5 % EX CREA: TOPICAL_CREAM | CUTANEOUS | 0 refills | Status: AC

## 2024-01-04 MED ORDER — FEXOFENADINE HCL 180 MG PO TABS: 180.0000 mg | ORAL_TABLET | Freq: Every day | ORAL | 0 refills | Status: AC

## 2024-01-04 NOTE — ED Provider Notes (Signed)
 [place] EMERGENCY DEPARTMENT AT [facility] Provider Note   CSN: [id number] Arrival date & time: 01/04/24  1456     Patient presents with: Rash   Victoria Savage is a 43 y.o. female. No significant PMH.  Presents for itchy rash x 1 month.  She was urgent care and got an injection of steroids.  She still having itching.  She states she has some relief with using moisturizer-she uses coconut oil for this.  She has a good relief when she has a shower.  Itching is much worse at nighttime.  No contacts with similar rash, she has not passed the past.  No fevers or chills, no lesions on her palms or soles or in her mouth.  She denies any new cosmetics, detergents or medications.    Rash      Prior to Admission medications   Medication Sig Start Date End Date Taking? Authorizing Provider  Blood Pressure Monitor MISC For regular home bp monitoring during pregnancy Patient not taking: Reported on 07/30/2022 05/01/22   Loreli Suzen BIRCH, CNM  fexofenadine (ALLEGRA) 180 MG tablet Take 180 mg by mouth daily.    [provider]  ibuprofen  (ADVIL ) 600 MG tablet Take 1 tablet (600 mg total) by mouth every 6 (six) hours as needed. Patient not taking: Reported on 07/30/2022 06/26/22   Constant, Peggy, MD  omeprazole  (PRILOSEC) 20 MG capsule Take 1 capsule (20 mg total) by mouth daily. 1 tablet a day 03/07/22   Jayne Vonn DEL, MD  Prenatal Vit-Fe Fumarate-FA (PRENATAL VITAMIN PO) Take by mouth. Patient not taking: Reported on 07/30/2022    [provider]  triamterene -hydrochlorothiazide (MAXZIDE-25) 37.5-25 MG tablet Take 1 tablet by mouth daily. 07/03/22   Loreli Suzen BIRCH, CNM    Allergies: Sulfa antibiotics    Review of Systems  Skin:  Positive for rash.    Updated Vital Signs BP 118/87   Pulse 86   Temp 98.5 F (36.9 C)   Resp 16   Ht 5' 2 (1.575 m)   Wt 86.2 kg   SpO2 100%   BMI 34.75 kg/m   Physical Exam Vitals and nursing note reviewed.   Constitutional:      General: She is not in acute distress.    Appearance: She is well-developed.  HENT:     Head: Normocephalic and atraumatic.  Eyes:     Conjunctiva/sclera: Conjunctivae normal.  Cardiovascular:     Rate and Rhythm: Normal rate and regular rhythm.     Heart sounds: No murmur heard. Pulmonary:     Effort: Pulmonary effort is normal. No respiratory distress.     Breath sounds: Normal breath sounds.  Abdominal:     Palpations: Abdomen is soft.     Tenderness: There is no abdominal tenderness.  Musculoskeletal:        General: No swelling.     Cervical back: Neck supple.  Skin:    General: Skin is warm and dry.     Capillary Refill: Capillary refill takes less than 2 seconds.     Comments: Few open sores with some lesions with small linear tracks noted to bilateral dorsum of the hands and forearms.  No cellulitis.  No petechia or purpura, no raised lesions.  Neurological:     Mental Status: She is alert.  Psychiatric:        Mood and Affect: Mood normal.     (all labs ordered are listed, but only abnormal results are displayed) Labs Reviewed -  No data to display  EKG: None  Radiology: No results found.   Procedures   Medications Ordered in the ED - No data to display                                  Medical Decision Making ED course: Patient presents to ER for evaluation of rash x 1 month is very itchy and worse at night.  On exam this is consistent with likely scabies.  We discussed we will start her on antihistamine to help with itching and give permethrin, also given for her to follow-up with dermatology in case symptoms do not resolve with treatment.  Advised on washing all of her clothing and bedding in hot water.  She was given strict return precautions.  Risk OTC drugs. Prescription drug management.        Final diagnoses:  None    ED Discharge Orders     None          Suellen Sherran DELENA DEVONNA 01/04/24 1839    Melvenia Motto, MD 01/05/24 [phone]

## 2024-01-04 NOTE — Discharge Instructions (Addendum)
 You were seen in the ER today for a rash.  On exam this looks consistent with scabies, so I am going to treat you with permanent skin cream.  Apply less from head to toe, avoiding your eyes and mouth and leave on overnight for 12 hours and then rinse thoroughly.  Use the Allegra for itching.  Wash all of your clothes and bedding in hot water.  If you not getting better follow-up with dermatology.  Come back to the ER if you have new or worsening symptoms.

## 2024-01-04 NOTE — ED Triage Notes (Signed)
 Pt arrives with c/o of a rash to bilateral arms and face. Pt reports itchiness. Pt reports relief with benadryl  and lotion temporarily.

## 2024-02-02 ENCOUNTER — Other Ambulatory Visit: Payer: Self-pay

## 2024-02-02 ENCOUNTER — Emergency Department (HOSPITAL_COMMUNITY)
Admission: EM | Admit: 2024-02-02 | Discharge: 2024-02-03 | Disposition: A | Discharge status: Home / Self Care | Attending: Emergency Medicine | Admitting: Emergency Medicine

## 2024-02-02 ENCOUNTER — Encounter (HOSPITAL_COMMUNITY): Payer: Self-pay | Admitting: Emergency Medicine

## 2024-02-02 DIAGNOSIS — L0291 Cutaneous abscess, unspecified: Secondary | ICD-10-CM

## 2024-02-02 DIAGNOSIS — Z87891 Personal history of nicotine dependence: Secondary | ICD-10-CM | POA: Diagnosis not present

## 2024-02-02 DIAGNOSIS — R7309 Other abnormal glucose: Secondary | ICD-10-CM | POA: Diagnosis not present

## 2024-02-02 DIAGNOSIS — L02416 Cutaneous abscess of left lower limb: Secondary | ICD-10-CM | POA: Insufficient documentation

## 2024-02-02 DIAGNOSIS — R21 Rash and other nonspecific skin eruption: Secondary | ICD-10-CM | POA: Diagnosis not present

## 2024-02-02 NOTE — ED Triage Notes (Signed)
 Pt c/o rash to face and groin x 2 weeks. Pt also c/o abscess to the left groin.

## 2024-02-03 LAB — CBG MONITORING, ED: Glucose-Capillary: 109 mg/dL — ABNORMAL HIGH (ref 70–99)

## 2024-02-03 MED ORDER — DOXYCYCLINE HYCLATE 100 MG PO TABS
100.0000 mg | ORAL_TABLET | Freq: Once | ORAL | Status: AC
  Administered 2024-02-03: 100 mg via ORAL
  Filled 2024-02-03: qty 1

## 2024-02-03 MED ORDER — FLUCONAZOLE 200 MG PO TABS: 200.0000 mg | ORAL_TABLET | Freq: Every day | ORAL | 0 refills | Status: AC

## 2024-02-03 MED ORDER — HYDROXYZINE HCL 25 MG PO TABS: 25.0000 mg | ORAL_TABLET | Freq: Three times a day (TID) | ORAL | 0 refills | Status: AC | PRN

## 2024-02-03 MED ORDER — DOXYCYCLINE HYCLATE 100 MG PO CAPS: 100.0000 mg | ORAL_CAPSULE | Freq: Two times a day (BID) | ORAL | 0 refills | Status: AC

## 2024-02-03 NOTE — ED Provider Notes (Signed)
 Emergency Department Provider Note  TRIAGE NOTE: Pt c/o rash to face and groin x 2 weeks. Pt also c/o abscess to the left groin.  HISTORY  Chief Complaint Rash   HPI Mata Rowen is a 43 y.o. female with   a chief complaint of severe itching and a rash localized to the arms, which has been present for two to three weeks. The patient describes the rash as being on the surface of the skin, accompanied by small bumps and a boil, with no associated vaginal symptoms or discharge. The patient has a history of cystic acne and experiences recurrent boils, which she manages independently. She has attempted various treatments, including Monistat for seven days, sitz baths, and topical applications such as baby powder and Vaseline, with temporary relief from Vaseline. The patient reports that hot water exacerbates the itching. She is currently taking iron pills and questions if they could be contributing to her symptoms. The patient denies a history of diabetes but notes a family history of diabetes on both sides and reports having hypoglycemia. She is allergic to sulfa drugs. History was obtained from the patient.  PMH Past Medical History:  Diagnosis Date   ADHD (attention deficit hyperactivity disorder)    Anemia    Gestational diabetes    Hypoglycemia     Home Medications Prior to Admission medications   Medication Sig Start Date End Date Taking? Authorizing Provider  doxycycline  (VIBRAMYCIN ) 100 MG capsule Take 1 capsule (100 mg total) by mouth 2 (two) times daily. One po bid x 7 days 02/03/24  Yes Kalla Watson, Selinda, MD  fluconazole  (DIFLUCAN ) 200 MG tablet Take 1 tablet (200 mg total) by mouth daily. 02/04/24  Yes Rennee Coyne, Selinda, MD  hydrOXYzine  (ATARAX ) 25 MG tablet Take 1 tablet (25 mg total) by mouth every 8 (eight) hours as needed for itching. 02/03/24  Yes Ananias Kolander, Selinda, MD  Blood Pressure Monitor MISC For regular home bp monitoring during pregnancy Patient not taking: Reported on  07/30/2022 05/01/22   Loreli Suzen BIRCH, CNM  fexofenadine  (ALLEGRA ) 180 MG tablet Take 1 tablet (180 mg total) by mouth daily. 01/04/24   Suellen Cantor A, PA-C  ibuprofen  (ADVIL ) 600 MG tablet Take 1 tablet (600 mg total) by mouth every 6 (six) hours as needed. Patient not taking: Reported on 07/30/2022 06/26/22   Constant, Peggy, MD  omeprazole  (PRILOSEC) 20 MG capsule Take 1 capsule (20 mg total) by mouth daily. 1 tablet a day 03/07/22   Jayne Vonn DEL, MD  permethrin  (ELIMITE ) 5 % cream Apply to affected area once 01/04/24   Suellen Cantor A, PA-C  Prenatal Vit-Fe Fumarate-FA (PRENATAL VITAMIN PO) Take by mouth. Patient not taking: Reported on 07/30/2022    [provider]  triamterene -hydrochlorothiazide (MAXZIDE-25) 37.5-25 MG tablet Take 1 tablet by mouth daily. 07/03/22   Loreli Suzen BIRCH, CNM    Social History Social History   Tobacco Use   Smoking status: Former    Types: Cigarettes  Vaping Use   Vaping status: Never Used  Substance Use Topics   Alcohol use: Not Currently   Drug use: No    Review of Systems: Documented in HPI ____________________________________________  PHYSICAL EXAM: VITAL SIGNS: Triage: Blood pressure (!) 134/97, pulse 77, temperature 98 F (36.7 C), temperature source Oral, resp. rate 18, height 5' 2 (1.575 m), weight 86.2 kg, SpO2 97%, not currently breastfeeding.  Vitals:   02/02/24 2330 02/02/24 2331  BP:  (!) 134/97  Pulse:  77  Resp:  18  Temp:  98 F (36.7 C)  TempSrc:  Oral  SpO2:  97%  Weight: 86.2 kg   Height: 5' 2 (1.575 m)     Physical Exam Vitals and nursing note reviewed.  Constitutional:      Appearance: She is well-developed.  HENT:     Head: Normocephalic and atraumatic.  Cardiovascular:     Rate and Rhythm: Normal rate and regular rhythm.  Pulmonary:     Effort: No respiratory distress.     Breath sounds: No stridor.  Abdominal:     General: There is no distension.  Musculoskeletal:     Cervical back:  Normal range of motion.  Skin:    Comments: Chaperoned by nurse - beth  Multiple raised, erythematous papules. In and around perineal region. No ulcers. No discoloration  Has a 3x3 cm area of induration, edema, ttp, warmth to left inner thigh without fluctuance.   Neurological:     Mental Status: She is alert.       ____________________________________________   LABS (all labs ordered are listed, but only abnormal results are displayed)  Labs Reviewed  CBG MONITORING, ED   ____________________________________________  EKG   EKG Interpretation Date/Time:    Ventricular Rate:    PR Interval:    QRS Duration:    QT Interval:    QTC Calculation:   R Axis:      Text Interpretation:          ____________________________________________  RADIOLOGY  No results found. ____________________________________________  PROCEDURES  Procedure(s) performed:   Procedures ____________________________________________  INITIAL IMPRESSION / ASSESSMENT AND PLAN       Images ordered viewed and obtained by myself. Agree with Radiology interpretation. Details in ED course.  Labs ordered reviewed by myself as detailed in ED course.  Consultations obtained/considered detailed in ED course.    CRITICAL INTERVENTIONS:  N/a  Patient with a rash consistent with likely yeast versus genital dermatitis.  Will treat with Diflucan  as she is already tried over-the-counter regimens along with Atarax  for symptoms.  Also has what appears to be an abscess but is already been drained.  No fluctuance minimal surrounding cellulitis will start antibiotics and she will continue sitz bath's and warm compresses.  She will return if it gets larger again or stops draining.  Follow-up with PCP.   FINAL IMPRESSION Final diagnoses:  Abscess  Rash     Disposition A medical screening exam was performed and I feel the patient has had an appropriate workup for their chief complaint at this time and  likelihood of emergent condition existing is low. They have been counseled on decision, DISCHARGE, follow up and which symptoms necessitate immediate return to the emergency department. They or their family verbally stated understanding and agreement with plan and discharged in stable condition.   ____________________________________________   NEW OUTPATIENT MEDICATIONS STARTED DURING THIS VISIT:  New Prescriptions   DOXYCYCLINE  (VIBRAMYCIN ) 100 MG CAPSULE    Take 1 capsule (100 mg total) by mouth 2 (two) times daily. One po bid x 7 days   FLUCONAZOLE  (DIFLUCAN ) 200 MG TABLET    Take 1 tablet (200 mg total) by mouth daily.   HYDROXYZINE  (ATARAX ) 25 MG TABLET    Take 1 tablet (25 mg total) by mouth every 8 (eight) hours as needed for itching.    Note:  This note was prepared with assistance of Dragon voice recognition software. Occasional wrong-word or sound-a-like substitutions may have occurred due to the inherent limitations of voice recognition software.  Belvia Gotschall, Selinda, MD 02/03/24 [id number]

## 2024-02-03 NOTE — ED Notes (Addendum)
CBG 109 

## 2024-03-16 ENCOUNTER — Encounter: Payer: Self-pay | Admitting: Women's Health

## 2024-03-16 ENCOUNTER — Ambulatory Visit: Admitting: Women's Health

## 2024-03-16 ENCOUNTER — Other Ambulatory Visit (HOSPITAL_COMMUNITY)
Admission: RE | Admit: 2024-03-16 | Discharge: 2024-03-16 | Disposition: A | Discharge status: Home / Self Care | Source: Ambulatory Visit | Attending: Women's Health | Admitting: Women's Health

## 2024-03-16 VITALS — BP 119/79 | HR 79 | Ht 62.0 in | Wt 199.0 lb

## 2024-03-16 DIAGNOSIS — N9089 Other specified noninflammatory disorders of vulva and perineum: Secondary | ICD-10-CM | POA: Diagnosis present

## 2024-03-16 DIAGNOSIS — L292 Pruritus vulvae: Secondary | ICD-10-CM

## 2024-03-16 DIAGNOSIS — L732 Hidradenitis suppurativa: Secondary | ICD-10-CM

## 2024-03-16 MED ORDER — SILVER SULFADIAZINE 1 % EX CREA: 1.0000 | TOPICAL_CREAM | Freq: Every day | CUTANEOUS | 0 refills | Status: AC

## 2024-03-16 MED ORDER — CLINDAMYCIN HCL 300 MG PO CAPS: 300.0000 mg | ORAL_CAPSULE | Freq: Three times a day (TID) | ORAL | 0 refills | Status: AC

## 2024-03-16 NOTE — Progress Notes (Signed)
 "  GYN VISIT Patient name: Victoria Savage MRN [id number]  Date of birth: [birth year] Chief Complaint:   Gynecologic Exam (Vaginal discharge and boil groin area that draining.irregular period)  History of Present Illness:   Victoria Savage is a 44 y.o. [id number] Caucasian female being seen today for sores on vulva and lower abdomen, vulvar irritation/itching. Denies abnormal discharge, itching/odor/irritation.  No h/o HSV. Thinks its yeast. Has recurrent boil Lt inner thigh, has popped and draining, much smaller now than it has been in past. H/O boils under axilla in past.     Patient's last menstrual period was 03/01/2024. The current method of family planning is Nexplanon .  Last pap 02/07/22. Results were: NILM w/ HRHPV negative     01/01/2022    2:30 PM 12/25/2021   12:42 PM 12/25/2021   12:36 PM  Depression screen PHQ 2/9  Decreased Interest 1 1 1   Down, Depressed, Hopeless 3 3 3   PHQ - 2 Score 4 4 4   Altered sleeping 3 3 3   Tired, decreased energy 0 0 0  Change in appetite 0 0 0  Feeling bad or failure about yourself  0 0 0  Trouble concentrating 0 0 0  Moving slowly or fidgety/restless 0 0 0  Suicidal thoughts 0 0 0  PHQ-9 Score 7  7  7       Data saved with a previous flowsheet row definition        01/01/2022    2:31 PM 12/25/2021   12:42 PM 12/25/2021   12:36 PM  GAD 7 : Generalized Anxiety Score  Nervous, Anxious, on Edge 0 0 0  Control/stop worrying 0 0 0  Worry too much - different things 0 0 0  Trouble relaxing 0 0 0  Restless 0 0 0  Easily annoyed or irritable 0 0 0  Afraid - awful might happen 0 0 0  Total GAD 7 Score 0 0 0     Review of Systems:   Pertinent items are noted in HPI Denies fever/chills, dizziness, headaches, visual disturbances, fatigue, shortness of breath, chest pain, abdominal pain, vomiting, abnormal vaginal discharge/itching/odor/irritation, problems with periods, bowel movements, urination, or intercourse unless otherwise stated above.   Pertinent History Reviewed:  Reviewed past medical,surgical, social, obstetrical and family history.  Reviewed problem list, medications and allergies. Physical Assessment:   Vitals:   03/16/24 1003  BP: 119/79  Pulse: 79  Weight: 199 lb (90.3 kg)  Height: 5' 2 (1.575 m)  Body mass index is 36.4 kg/m.       Physical Examination:   General appearance: alert, well appearing, and in no distress  Mental status: alert, oriented to person, place, and time  Skin: warm & dry   Cardiovascular: normal heart rate noted  Respiratory: normal respiratory effort, no distress  Abdomen: soft, non-tender, red sores lower abd in panus  Pelvic: red sores mons, some on Lt labia majora suspicious for HSV, culture obtained  Cx appears normal, on menses, CV swab obtained. ~3cm boil Lt inner thigh, draining  Co exam w/ LHE, feels she has HS  Extremities: no edema   Chaperone: Peggy Dones  No results found for this or any previous visit (from the past 24 hours).  Assessment & Plan:  1) Sores on vulva> some suspicious for HSV, culture sent  2) Sores lower abd/panus and boil Lt inner thigh> HS per LHE, rx clindamycin  and silvadene , see him back after finished w/ clinda  3) Vulvar itching/irritation> CV swab  Meds:  Meds  ordered this encounter  Medications   silver  sulfADIAZINE  (SILVADENE ) 1 % cream    Sig: Apply 1 Application topically daily.    Dispense:  50 g    Refill:  0   clindamycin  (CLEOCIN ) 300 MG capsule    Sig: Take 1 capsule (300 mg total) by mouth 3 (three) times daily. X 10 days    Dispense:  30 capsule    Refill:  0    No orders of the defined types were placed in this encounter.   Return in about 10 days (around 03/26/2024) for gyn f/u w/ Dr. Jayne only.  [name] CNM, [facility] 03/16/2024 10:51 AM  "

## 2024-03-16 NOTE — Patient Instructions (Signed)
Hidradenitis . Loose, light clothing. Avoid heat, friction, shearing. Don't squeeze! . Wash clothes in perfume/dye free detergent . Gentle non-soap cleanser, wash gently w/ fingers (No cloth, loofah), can use antibacterial cleanser . Stop smoking! . Weight loss . Decrease/no dairy  Hidradenitis Suppurativa Hidradenitis suppurativa is a long-term (chronic) skin disease that starts with blocked sweat glands or hair follicles. Bacteria may grow in these blocked openings of your skin. Hidradenitis suppurativa is like a severe form of acne that develops in areas of your body where acne would be unusual. It is most likely to affect the areas of your body where skin rubs against skin and becomes moist. This includes your:  Underarms.  Groin.  Genital areas.  Buttocks.  Upper thighs.  Breasts. Hidradenitis suppurativa may start out with small pimples. The pimples can develop into deep sores that break open (rupture) and drain pus. Over time your skin may thicken and become scarred. Hidradenitis suppurativa cannot be passed from person to person. What are the causes? The exact cause of hidradenitis suppurativa is not known. This condition may be due to:  Female and female hormones. The condition is rare before and after puberty.  An overactive body defense system (immune system). Your immune system may overreact to the blocked hair follicles or sweat glands and cause swelling and pus-filled sores. What increases the risk? You may have a higher risk of hidradenitis suppurativa if you:  Are a woman.  Are between ages 11 and 55.  Have a family history of hidradenitis suppurativa.  Have a personal history of acne.  Are overweight.  Smoke.  Take the drug lithium. What are the signs or symptoms? The first signs of an outbreak are usually painful skin bumps that look like pimples. As the condition progresses:  Skin bumps may get bigger and grow deeper into the skin.  Bumps under the  skin may rupture and drain smelly pus.  Skin may become itchy and infected.  Skin may thicken and scar.  Drainage may continue through tunnels under the skin (fistulas).  Walking and moving your arms can become painful. How is this diagnosed? Your health care provider may diagnose hidradenitis suppurativa based on your medical history and your signs and symptoms. A physical exam will also be done. You may need to see a health care provider who specializes in skin diseases (dermatologist). You may also have tests done to confirm the diagnosis. These can include:  Swabbing a sample of pus or drainage from your skin so it can be sent to the lab and tested for infection.  Blood tests to check for infection. How is this treated? The same treatment will not work for everybody with hidradenitis suppurativa. Your treatment will depend on how severe your symptoms are. You may need to try several treatments to find what works best for you. Part of your treatment may include cleaning and bandaging (dressing) your wounds. You may also have to take medicines, such as the following:  Antibiotics.  Acne medicines.  Medicines to block or suppress the immune system.  A diabetes medicine (metformin) is sometimes used to treat this condition.  For women, birth control pills can sometimes help relieve symptoms. You may need surgery if you have a severe case of hidradenitis suppurativa that does not respond to medicine. Surgery may involve:  Using a laser to clear the skin and remove hair follicles.  Opening and draining deep sores.  Removing the areas of skin that are diseased and scarred. Follow these instructions   at home:  Learn as much as you can about your disease, and work closely with your health care providers.  Take medicines only as directed by your health care provider.  If you were prescribed an antibiotic medicine, finish it all even if you start to feel better.  If you are  overweight, losing weight may be very helpful. Try to reach and maintain a healthy weight.  Do not use any tobacco products, including cigarettes, chewing tobacco, or electronic cigarettes. If you need help quitting, ask your health care provider.  Do not shave the areas where you get hidradenitis suppurativa.  Do not wear deodorant.  Wear loose-fitting clothes.  Try not to overheat and get sweaty.  Take a daily bleach bath as directed by your health care provider.  Fill your bathtub halfway with water.  Pour in  cup of unscented household bleach.  Soak for 5-10 minutes.  Cover sore areas with a warm, clean washcloth (compress) for 5-10 minutes. Contact a health care provider if:  You have a flare-up of hidradenitis suppurativa.  You have chills or a fever.  You are having trouble controlling your symptoms at home. This information is not intended to replace advice given to you by your health care provider. Make sure you discuss any questions you have with your health care provider. Document Released: 10/10/2003 Document Revised: 08/03/2015 Document Reviewed: 05/28/2013 Elsevier Interactive Patient Education  2017 Elsevier Inc.    

## 2024-03-16 NOTE — Addendum Note (Signed)
 Addended by: SANNA GONG A on: 03/16/2024 11:16 AM   Modules accepted: Orders

## 2024-03-18 ENCOUNTER — Ambulatory Visit: Payer: Self-pay | Admitting: Women's Health

## 2024-03-18 LAB — CERVICOVAGINAL ANCILLARY ONLY
Bacterial Vaginitis (gardnerella): NEGATIVE
Candida Glabrata: NEGATIVE
Candida Vaginitis: NEGATIVE
Chlamydia: NEGATIVE
Comment: NEGATIVE
Comment: NEGATIVE
Comment: NEGATIVE
Comment: NEGATIVE
Comment: NEGATIVE
Comment: NORMAL
Neisseria Gonorrhea: NEGATIVE
Trichomonas: NEGATIVE

## 2024-03-18 LAB — HERPES SIMPLEX VIRUS CULTURE

## 2024-03-29 ENCOUNTER — Other Ambulatory Visit: Payer: Self-pay | Admitting: Women's Health

## 2024-03-29 DIAGNOSIS — N898 Other specified noninflammatory disorders of vagina: Secondary | ICD-10-CM

## 2024-03-31 ENCOUNTER — Encounter: Payer: Self-pay | Admitting: Obstetrics & Gynecology

## 2024-03-31 ENCOUNTER — Other Ambulatory Visit: Admitting: Radiology

## 2024-03-31 ENCOUNTER — Ambulatory Visit: Admitting: Obstetrics & Gynecology

## 2024-03-31 VITALS — BP 118/77 | HR 79 | Ht 62.0 in | Wt 202.8 lb

## 2024-03-31 DIAGNOSIS — L732 Hidradenitis suppurativa: Secondary | ICD-10-CM | POA: Diagnosis not present

## 2024-03-31 DIAGNOSIS — N898 Other specified noninflammatory disorders of vagina: Secondary | ICD-10-CM

## 2024-03-31 NOTE — Progress Notes (Signed)
 GYN US : TA and TV imaging performed - vinyl probe cover used - Chaperone: Tish Anteverted uterus normal in size, slightly inhomogeneous myometrium, no focal abn seen, discordance in myometrial walls with posterior wall thicker. ? adenomyosis Endom thickness = 1.6 mm, uniform avascular cavity and canal, no evidence of intracavitary defects Ovaries appear normal in size,  The ovaries appear mobile, normal blood flow to both ovaries, neg adnexal regions,  neg CDS, no free fluid present

## 2024-03-31 NOTE — Progress Notes (Signed)
 Follow up appointment  Hiddradentis suppurativa  Chief Complaint  Patient presents with   Follow-up    HS, GYN u/s today    Blood pressure 118/77, pulse 79, height 5' 2 (1.575 m), weight 202 lb 12.8 oz (92 kg), last menstrual period 03/01/2024, not currently breastfeeding.  US  PELVIC COMPLETE WITH TRANSVAGINAL Result Date: 03/31/2024 Images from the original result were not included.  ..[facility] [employer] [facility]tras[facility] TECHNICAL SALES ENGINEER) accredited practice Center for [facility] [address] [place] [address] Ordering Provider: Kizzie Suzen Savage, CNM                GYNECOLOGIC SONOGRAM EXAM: TRANSABDOMINAL AND TRANSVAGINAL ULTRASOUND OF PELVIS  TECHNIQUE: Transabdominal and transvaginal ultrasound examinations of the pelvis were performed. Transabdominal technique was performed for global imaging of the pelvis including uterus, ovaries, adnexal regions, and pelvic cul-de-sac. It was necessary to proceed with endovaginal exam following the transabdominal exam to better visualize and improve detailed examination of  the uterus, endometrium and bilateral ovaries, as well as, evaluation of ovarian blood flow and sliding characteristics. Victoria Savage is a 44 y.o. [id number] Patient's last menstrual period was 03/01/2024. for a pelvic sonogram for HS/has Nexplanon . Uterus                      5.8 x 4.9 x 5.7 cm, Total uterine volume 85 cc slightly inhomogeneous myometrium, no focal abn seen, discordance in myometrial walls with posterior wall thicker. ? adenomyosis Endometrium          1.6 mm, symmetrical, uniform avascular cavity and canal, no evidence of intracavitary defects Right ovary             4.5 x 2.6 x 2.8 cm, mobile, normal blood flow Left ovary                3.4 x 1.5 x 3.0 cm, mobile, normal blood flow Technician Comments: GYN US : TA and TV imaging performed - vinyl probe cover used - Chaperone: Victoria Savage uterus normal in size, slightly inhomogeneous  myometrium, no focal abn seen, discordance in myometrial walls with posterior wall thicker. ? adenomyosis Endom thickness = 1.6 mm, uniform avascular cavity and canal, no evidence of intracavitary defects Ovaries appear normal in size,  The ovaries appear mobile, normal blood flow to both ovaries, neg adnexal regions, neg CDS, no free fluid present Victoria Savage Fair 03/31/2024 10:00 AM Clinical Impression and recommendations: I have reviewed the sonogram results above, combined with the patient's current clinical course, below are my impressions and any appropriate recommendations for management based on the sonographic findings. Uterus normal size shape and contour Endometrium thin endometrium on Nexplanon  Ovaries: both are normal size shape morphology Victoria Savage Victoria Savage 03/31/2024 11:05 AM     Normal sonogram above  HS lesions significantly better, patient feels a lot better Finishing cleocin   Recommend continue to use silvadene  prn  MEDS ordered this encounter: No orders of the defined types were placed in this encounter.   Orders for this encounter: No orders of the defined types were placed in this encounter.   Impression + Management Plan   ICD-10-CM   1. Hidradenitis suppurativa: chronic, use topical silvadene  as directed  L73.2       Follow Up: Return in about 3 months (around 06/29/2024) for Follow up, with Victoria Savage.     All questions were answered.  Past Medical History:  Diagnosis Date   ADHD (attention deficit hyperactivity disorder)  Anemia    Gestational diabetes    Hypoglycemia     History reviewed. No pertinent surgical history.  OB History     Gravida  5   Para  5   Term  5   Preterm      AB      Living  5      SAB      IAB      Ectopic      Multiple  0   Live Births  5           Allergies[1]  Social History   Socioeconomic History   Marital status: Married    Spouse name: Not on file   Number of children: Not on file   Years of  education: Not on file   Highest education level: Not on file  Occupational History   Not on file  Tobacco Use   Smoking status: Former    Types: Cigarettes   Smokeless tobacco: Not on file  Vaping Use   Vaping status: Never Used  Substance and Sexual Activity   Alcohol use: Not Currently   Drug use: No   Sexual activity: Yes    Birth control/protection: None, Implant  Other Topics Concern   Not on file  Social History Narrative   Not on file   Social Drivers of Health   Tobacco Use: Medium Risk (03/31/2024)   Patient History    Smoking Tobacco Use: Former    Smokeless Tobacco Use: Unknown    Passive Exposure: Not on file  Financial Resource Strain: Low Risk (12/25/2021)   Overall Financial Resource Strain (CARDIA)    Difficulty of Paying Living Expenses: Not hard at all  Food Insecurity: No Food Insecurity (06/25/2022)   Hunger Vital Sign    Worried About Running Out of Food in the Last Year: Never true    Ran Out of Food in the Last Year: Never true  Transportation Needs: No Transportation Needs (06/25/2022)   PRAPARE - Administrator, Civil Service (Medical): No    Lack of Transportation (Non-Medical): No  Physical Activity: Insufficiently Active (12/25/2021)   Exercise Vital Sign    Days of Exercise per Week: 3 days    Minutes of Exercise per Session: 30 min  Stress: Stress Concern Present (12/25/2021)   [facility]ia [facility] [employer]ational [employer] - Occupational Stress Questionnaire    Feeling of Stress : Rather much  Social Connections: Moderately Isolated (12/25/2021)   Social Connection and Isolation Panel    Frequency of Communication with Friends and Family: Never    Frequency of Social Gatherings with Friends and Family: Never    Attends Religious Services: More than 4 times per year    Active Member of Clubs or Organizations: No    Attends Banker Meetings: Never    Marital Status: Living with partner  Depression (PHQ2-9): Medium Risk  (01/01/2022)   Depression (PHQ2-9)    PHQ-2 Score: 7  Alcohol Screen: Low Risk (12/25/2021)   Alcohol Screen    Last Alcohol Screening Score (AUDIT): 0  Housing: Low Risk (06/25/2022)   Housing    Last Housing Risk Score: 0  Utilities: Not At Risk (06/25/2022)   AHC Utilities    Threatened with loss of utilities: No  Health Literacy: Not on file    Family History  Problem Relation Age of Onset   Hypertension Mother    Diabetes Mother    Liver cancer Mother    Diabetes  Maternal Grandfather        [1]  Allergies Allergen Reactions   Sulfa Antibiotics Other (See Comments)    Lowers patients blood sugar. Makes pass out.

## 2024-06-18 ENCOUNTER — Ambulatory Visit: Admitting: Obstetrics & Gynecology
